# Patient Record
Sex: Female | Born: 1985 | Race: White | Hispanic: No | Marital: Married | Smoking: Never smoker
Health system: Southern US, Community
[De-identification: ages and names within clinical notes are randomized; demographics above are authoritative.]

## PROBLEM LIST (undated history)

## (undated) DIAGNOSIS — R51 Headache: Secondary | ICD-10-CM

## (undated) DIAGNOSIS — R519 Headache, unspecified: Secondary | ICD-10-CM

## (undated) HISTORY — PX: WISDOM TOOTH EXTRACTION: SHX21

---
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
## 2006-01-21 ENCOUNTER — Ambulatory Visit: Payer: Self-pay | Admitting: Internal Medicine

## 2006-03-11 ENCOUNTER — Ambulatory Visit: Payer: Self-pay | Admitting: Internal Medicine

## 2007-08-18 ENCOUNTER — Encounter: Payer: Self-pay | Admitting: Internal Medicine

## 2007-08-18 DIAGNOSIS — G43909 Migraine, unspecified, not intractable, without status migrainosus: Secondary | ICD-10-CM | POA: Insufficient documentation

## 2008-04-25 ENCOUNTER — Ambulatory Visit: Payer: Self-pay | Admitting: Internal Medicine

## 2008-04-25 ENCOUNTER — Ambulatory Visit (HOSPITAL_BASED_OUTPATIENT_CLINIC_OR_DEPARTMENT_OTHER): Admission: RE | Admit: 2008-04-25 | Discharge: 2008-04-25 | Payer: Self-pay | Admitting: Internal Medicine

## 2008-04-25 ENCOUNTER — Telehealth: Payer: Self-pay | Admitting: Internal Medicine

## 2008-04-25 DIAGNOSIS — M545 Low back pain, unspecified: Secondary | ICD-10-CM | POA: Insufficient documentation

## 2010-10-14 NOTE — Assessment & Plan Note (Signed)
Summary: hurt back/mhf   Vital Signs:  Patient Profile:   25 Years Old Female Height:     66 inches Weight:      158 pounds BMI:     25.59 Temp:     98.5 degrees F oral Pulse rate:   75 / minute BP sitting:   106 / 77  (left arm) Cuff size:   regular  Pt. in pain?   yes    Location:   lower back    Intensity:   7    Type:       dull  Vitals Entered By: Glendell Docker CMA (April 25, 2008 11:26 AM)                  Chief Complaint:  Back Pain and Back pain.  History of Present Illness:  Back Pain      This is a 25 year old woman who presents with Back pain.  The patient denies fever, chills, weakness, and loss of sensation.  The pain began at work, at home, gradually, and after lifting.  The pain is made worse by standing or walking and extension.      Current Allergies (reviewed today): No known allergies   Past Medical History:    History of Migraine Headaches    Family History:    Migraine Headache  Social History:    Occupation:  Holiday representative at [employer]    Single    Never Smoked    Alcohol use-yes (social)   Risk Factors:  Tobacco use:  never Alcohol use:  yes   Review of Systems      See HPI   Physical Exam  General:     alert, well-developed, and well-nourished.   Head:     normocephalic and atraumatic.   Lungs:     normal respiratory effort and normal breath sounds.   Heart:     normal rate, regular rhythm, no murmur, and no gallop.   Neurologic:     strength normal in all extremities, gait normal, and DTRs symmetrical and normal.     Detailed Back/Spine Exam  Lumbosacral Exam:  Inspection-deformity:    Normal Palpation-spinal tenderness:     mild tenderness over left lumbar paraspinal muscles. Lying Straight Leg Raise:    Right:  negative    Left:  negative Toe Walking:    Right:  normal    Left:  normal Heel Walking:    Right:  normal    Left:  normal    Impression & Recommendations:  Problem # 1:  LOW BACK PAIN, CHRONIC  (ICD-724.2) Pt has hx of intermittent LBP for last 4 yrs.  She previously attributed to playing golf.  Her symptoms exacerbated by moving furniture.  No radicular symptoms.  Her exam is benign.  I suspect back strain.   Trial of celebrex 200 mg x 1-2 wks.  Samples provided.  Check x ray considering chronicity of pain.  Pt is going back to [employer].  I advised against heavy lifting.  If persistent pain,  arrange MRI of LS spine.  Her updated medication list for this problem includes:    Skelaxin 800 Mg Tabs (Metaxalone) ..... One by mouth three times a day prn  Orders: T-Lumbar Spine 2 Views (72100TC)   Complete Medication List: 1)  Zovia 1/35e (28) 1-35 Mg-mcg Tabs (Ethynodiol diac-eth estradiol) .... Take 1 tablet by mouth once a day 2)  Zoloft 100 Mg Tabs (Sertraline hcl) .... Take 1 tablet by  mouth once a day 3)  Skelaxin 800 Mg Tabs (Metaxalone) .... One by mouth three times a day prn    Prescriptions: SKELAXIN 800 MG  TABS (METAXALONE) one by mouth three times a day prn  #30 x 1   Entered and Authorized by:   D. Thomos Lemons DO   Signed by:   D. Thomos Lemons DO on 04/25/2008   Method used:   Electronically sent to ...       [facility] [id number]       [address]       [place], [place]  [address]       Ph: [phone] or [phone]       Fax: [phone]   RxID:   [id number]  ] Current Allergies (reviewed today): No known allergies

## 2015-09-30 ENCOUNTER — Encounter (HOSPITAL_COMMUNITY): Payer: Self-pay | Admitting: *Deleted

## 2015-09-30 ENCOUNTER — Inpatient Hospital Stay (HOSPITAL_COMMUNITY)
Admission: AD | Admit: 2015-09-30 | Discharge: 2015-10-05 | DRG: 765 | Disposition: A | Discharge status: Home / Self Care | Payer: Managed Care, Other (non HMO) | Source: Ambulatory Visit | Attending: Obstetrics and Gynecology | Admitting: Obstetrics and Gynecology

## 2015-09-30 DIAGNOSIS — O99214 Obesity complicating childbirth: Secondary | ICD-10-CM | POA: Diagnosis present

## 2015-09-30 DIAGNOSIS — Z98891 History of uterine scar from previous surgery: Secondary | ICD-10-CM

## 2015-09-30 DIAGNOSIS — O1414 Severe pre-eclampsia complicating childbirth: Principal | ICD-10-CM | POA: Diagnosis present

## 2015-09-30 DIAGNOSIS — O1410 Severe pre-eclampsia, unspecified trimester: Secondary | ICD-10-CM | POA: Diagnosis present

## 2015-09-30 DIAGNOSIS — Z6841 Body Mass Index (BMI) 40.0 and over, adult: Secondary | ICD-10-CM

## 2015-09-30 DIAGNOSIS — Z3A35 35 weeks gestation of pregnancy: Secondary | ICD-10-CM

## 2015-09-30 HISTORY — DX: Headache: R51

## 2015-09-30 HISTORY — DX: Headache, unspecified: R51.9

## 2015-09-30 LAB — URINALYSIS, ROUTINE W REFLEX MICROSCOPIC
Bilirubin Urine: NEGATIVE
Glucose, UA: NEGATIVE mg/dL
KETONES UR: NEGATIVE mg/dL
LEUKOCYTES UA: NEGATIVE
NITRITE: NEGATIVE
PH: 5.5 (ref 5.0–8.0)
Protein, ur: NEGATIVE mg/dL
Specific Gravity, Urine: 1.025 (ref 1.005–1.030)

## 2015-09-30 LAB — CBC
HEMATOCRIT: 43.7 % (ref 36.0–46.0)
HEMOGLOBIN: 14.9 g/dL (ref 12.0–15.0)
MCH: 32.9 pg (ref 26.0–34.0)
MCHC: 34.1 g/dL (ref 30.0–36.0)
MCV: 96.5 fL (ref 78.0–100.0)
Platelets: 261 10*3/uL (ref 150–400)
RBC: 4.53 MIL/uL (ref 3.87–5.11)
RDW: 13.3 % (ref 11.5–15.5)
WBC: 10.8 10*3/uL — ABNORMAL HIGH (ref 4.0–10.5)

## 2015-09-30 LAB — OB RESULTS CONSOLE GC/CHLAMYDIA
Chlamydia: NEGATIVE
Gonorrhea: NEGATIVE

## 2015-09-30 LAB — COMPREHENSIVE METABOLIC PANEL
ALBUMIN: 3.2 g/dL — AB (ref 3.5–5.0)
ALT: 11 U/L — AB (ref 14–54)
AST: 22 U/L (ref 15–41)
Alkaline Phosphatase: 151 U/L — ABNORMAL HIGH (ref 38–126)
Anion gap: 11 (ref 5–15)
BILIRUBIN TOTAL: 0.8 mg/dL (ref 0.3–1.2)
BUN: 16 mg/dL (ref 6–20)
CHLORIDE: 102 mmol/L (ref 101–111)
CO2: 23 mmol/L (ref 22–32)
CREATININE: 0.76 mg/dL (ref 0.44–1.00)
Calcium: 9.6 mg/dL (ref 8.9–10.3)
GFR calc Af Amer: >60 mL/min (ref >60)
GFR calc non Af Amer: >60 mL/min (ref >60)
GLUCOSE: 84 mg/dL (ref 65–99)
POTASSIUM: 4.8 mmol/L (ref 3.5–5.1)
Sodium: 136 mmol/L (ref 135–145)
Total Protein: 6.6 g/dL (ref 6.5–8.1)

## 2015-09-30 LAB — URIC ACID: Uric Acid, Serum: 7.6 mg/dL — ABNORMAL HIGH (ref 2.3–6.6)

## 2015-09-30 LAB — OB RESULTS CONSOLE HEPATITIS B SURFACE ANTIGEN: HEP B S AG: NEGATIVE

## 2015-09-30 LAB — OB RESULTS CONSOLE ABO/RH: RH TYPE: POSITIVE

## 2015-09-30 LAB — TYPE AND SCREEN
ABO/RH(D): A POS
ANTIBODY SCREEN: NEGATIVE

## 2015-09-30 LAB — URINE MICROSCOPIC-ADD ON

## 2015-09-30 LAB — OB RESULTS CONSOLE GBS: GBS: NEGATIVE

## 2015-09-30 LAB — OB RESULTS CONSOLE HIV ANTIBODY (ROUTINE TESTING): HIV: NONREACTIVE

## 2015-09-30 LAB — ABO/RH: ABO/RH(D): A POS

## 2015-09-30 LAB — OB RESULTS CONSOLE RPR: RPR: NONREACTIVE

## 2015-09-30 LAB — OB RESULTS CONSOLE RUBELLA ANTIBODY, IGM: RUBELLA: IMMUNE

## 2015-09-30 LAB — OB RESULTS CONSOLE ANTIBODY SCREEN: ANTIBODY SCREEN: NEGATIVE

## 2015-09-30 MED ORDER — OXYCODONE-ACETAMINOPHEN 5-325 MG PO TABS: 1.0000 | ORAL_TABLET | ORAL | Status: DC | PRN

## 2015-09-30 MED ORDER — MAGNESIUM SULFATE BOLUS VIA INFUSION
4.0000 g | Freq: Once | INTRAVENOUS | Status: AC
  Administered 2015-09-30: 4 g via INTRAVENOUS
  Filled 2015-09-30: qty 500

## 2015-09-30 MED ORDER — ACETAMINOPHEN 325 MG PO TABS: 650.0000 mg | ORAL_TABLET | ORAL | Status: DC | PRN

## 2015-09-30 MED ORDER — PENICILLIN G POTASSIUM 5000000 UNITS IJ SOLR
5.0000 10*6.[IU] | Freq: Once | INTRAMUSCULAR | Status: DC
  Filled 2015-09-30: qty 5

## 2015-09-30 MED ORDER — PENICILLIN G POTASSIUM 5000000 UNITS IJ SOLR
2.5000 10*6.[IU] | INTRAVENOUS | Status: DC
  Filled 2015-09-30 (×2): qty 2.5

## 2015-09-30 MED ORDER — MISOPROSTOL 25 MCG QUARTER TABLET
25.0000 ug | ORAL_TABLET | ORAL | Status: DC | PRN
  Administered 2015-09-30 – 2015-10-01 (×3): 25 ug via VAGINAL
  Filled 2015-09-30 (×3): qty 0.25

## 2015-09-30 MED ORDER — MAGNESIUM SULFATE 50 % IJ SOLN
2.0000 g/h | INTRAVENOUS | Status: DC
  Administered 2015-10-01: 2 g/h via INTRAVENOUS
  Filled 2015-09-30 (×2): qty 80

## 2015-09-30 MED ORDER — FLEET ENEMA 7-19 GM/118ML RE ENEM: 1.0000 | ENEMA | RECTAL | Status: DC | PRN

## 2015-09-30 MED ORDER — OXYCODONE-ACETAMINOPHEN 5-325 MG PO TABS: 2.0000 | ORAL_TABLET | ORAL | Status: DC | PRN

## 2015-09-30 MED ORDER — OXYTOCIN 10 UNIT/ML IJ SOLN: 2.5000 [IU]/h | INTRAVENOUS | Status: DC

## 2015-09-30 MED ORDER — OXYTOCIN BOLUS FROM INFUSION: 500.0000 mL | INTRAVENOUS | Status: DC

## 2015-09-30 MED ORDER — LABETALOL HCL 5 MG/ML IV SOLN
20.0000 mg | INTRAVENOUS | Status: DC | PRN
  Administered 2015-10-01: 20 mg via INTRAVENOUS
  Filled 2015-09-30: qty 4

## 2015-09-30 MED ORDER — PENICILLIN G POTASSIUM 5000000 UNITS IJ SOLR
2.5000 10*6.[IU] | INTRAVENOUS | Status: DC
  Administered 2015-10-01: 2.5 10*6.[IU] via INTRAVENOUS
  Filled 2015-09-30 (×5): qty 2.5

## 2015-09-30 MED ORDER — HYDRALAZINE HCL 20 MG/ML IJ SOLN: 10.0000 mg | Freq: Once | INTRAMUSCULAR | Status: DC | PRN

## 2015-09-30 MED ORDER — ONDANSETRON HCL 4 MG/2ML IJ SOLN: 4.0000 mg | Freq: Four times a day (QID) | INTRAMUSCULAR | Status: DC | PRN

## 2015-09-30 MED ORDER — TERBUTALINE SULFATE 1 MG/ML IJ SOLN: 0.2500 mg | Freq: Once | INTRAMUSCULAR | Status: DC | PRN

## 2015-09-30 MED ORDER — LIDOCAINE HCL (PF) 1 % IJ SOLN: 30.0000 mL | INTRAMUSCULAR | Status: DC | PRN

## 2015-09-30 MED ORDER — LACTATED RINGERS IV SOLN
INTRAVENOUS | Status: DC
  Administered 2015-09-30 – 2015-10-01 (×2): via INTRAVENOUS

## 2015-09-30 MED ORDER — CITRIC ACID-SODIUM CITRATE 334-500 MG/5ML PO SOLN: 30.0000 mL | ORAL | Status: DC | PRN

## 2015-09-30 MED ORDER — LACTATED RINGERS IV SOLN
500.0000 mL | INTRAVENOUS | Status: DC | PRN
  Administered 2015-10-01 (×2): 500 mL via INTRAVENOUS

## 2015-09-30 MED ORDER — BUTORPHANOL TARTRATE 1 MG/ML IJ SOLN: 1.0000 mg | INTRAMUSCULAR | Status: DC | PRN

## 2015-09-30 MED ORDER — DEXTROSE 5 % IV SOLN
5.0000 10*6.[IU] | Freq: Once | INTRAVENOUS | Status: AC
  Administered 2015-10-01: 5 10*6.[IU] via INTRAVENOUS
  Filled 2015-09-30: qty 5

## 2015-09-30 NOTE — H&P (Signed)
Aimee Li. BP at home ws 160/115 @ midnight, 156/105 this am. In office 154/108 with negative protein. Had HA last night but not now. Maternal Medical History:  Fetal activity: Perceived fetal activity is normal.      OB History    Gravida Para Term Preterm AB TAB SAB Ectopic Multiple Living   1         0     Past Medical History  Diagnosis Date  . Headache     migraines   Past Surgical History  Procedure Laterality Date  . Wisdom tooth extraction     Family History: family history is not on file. Social History:  reports that she has never smoked. She does not have any smokeless tobacco history on file. She reports that she does not drink alcohol or use illicit drugs.   Prenatal Transfer Tool  Maternal Diabetes: No Genetic Screening: Normal Maternal Ultrasounds/Referrals: Normal Fetal Ultrasounds or other Referrals:  None Maternal Substance Abuse:  No Significant Maternal Medications:  None Significant Maternal Lab Results:  None Other Comments:  None  Review of Systems  HENT:       HA last pm  Eyes: Negative for blurred vision.  Gastrointestinal: Negative for abdominal pain.  Neurological: Positive for headaches.      Blood pressure 143/92, pulse 99, resp. rate 18, height 5\' 4"  (1.626 m), weight 247 lb (112.038 kg). Maternal Exam:  Uterine Assessment: Contraction strength is mild.  Contraction frequency is irregular.   Abdomen: Patient reports no abdominal tenderness. Fetal presentation: vertex     Fetal Exam Fetal State Assessment: Category I - tracings are normal.     Physical Exam  Cardiovascular: Normal rate and regular rhythm.   Respiratory: Effort normal and breath sounds normal.  GI: Soft. There is no tenderness.  Neurological: She has normal reflexes.    Cx Cl/80/-3/vtx  Prenatal labs: ABO, Rh: A/Positive/-- (01/16 0000) Antibody: Negative (01/16 0000) Rubella: Immune (01/16 0000) RPR:  Nonreactive (01/16 0000)  HBsAg: Negative (01/16 0000)  HIV: Non-reactive (01/16 0000)  GBS:     Assessment/Plan: 30 yo G1P0 @ 35 6/7 weeks with BP C/W severe preeclampsia Will check labs, begin magnesium sulfate for seizure prophylaxis, and begin 2 stage induction D/W patient above. She states she understands and agrees.   Sena Clouatre II,Rivan Siordia E 09/30/2015, 5:05 PM

## 2015-10-01 ENCOUNTER — Inpatient Hospital Stay (HOSPITAL_COMMUNITY): Payer: Managed Care, Other (non HMO) | Admitting: Anesthesiology

## 2015-10-01 ENCOUNTER — Encounter (HOSPITAL_COMMUNITY)
Admission: AD | Disposition: A | Discharge status: Home / Self Care | Payer: Self-pay | Source: Ambulatory Visit | Attending: Obstetrics and Gynecology

## 2015-10-01 ENCOUNTER — Encounter (HOSPITAL_COMMUNITY): Payer: Self-pay | Admitting: Anesthesiology

## 2015-10-01 DIAGNOSIS — Z98891 History of uterine scar from previous surgery: Secondary | ICD-10-CM

## 2015-10-01 LAB — CBC
HCT: 40.5 % (ref 36.0–46.0)
HEMATOCRIT: 43.2 % (ref 36.0–46.0)
HEMATOCRIT: 41.8 % (ref 36.0–46.0)
HEMOGLOBIN: 14.4 g/dL (ref 12.0–15.0)
Hemoglobin: 14.7 g/dL (ref 12.0–15.0)
Hemoglobin: 13.9 g/dL (ref 12.0–15.0)
MCH: 32.7 pg (ref 26.0–34.0)
MCH: 32.8 pg (ref 26.0–34.0)
MCH: 33 pg (ref 26.0–34.0)
MCHC: 34 g/dL (ref 30.0–36.0)
MCHC: 34.4 g/dL (ref 30.0–36.0)
MCHC: 34.3 g/dL (ref 30.0–36.0)
MCV: 96.2 fL (ref 78.0–100.0)
MCV: 95.2 fL (ref 78.0–100.0)
MCV: 96.2 fL (ref 78.0–100.0)
PLATELETS: 210 10*3/uL (ref 150–400)
PLATELETS: 224 10*3/uL (ref 150–400)
Platelets: 223 10*3/uL (ref 150–400)
RBC: 4.49 MIL/uL (ref 3.87–5.11)
RBC: 4.39 MIL/uL (ref 3.87–5.11)
RBC: 4.21 MIL/uL (ref 3.87–5.11)
RDW: 13.3 % (ref 11.5–15.5)
RDW: 13.3 % (ref 11.5–15.5)
RDW: 13.4 % (ref 11.5–15.5)
WBC: 11.4 10*3/uL — AB (ref 4.0–10.5)
WBC: 13.5 10*3/uL — ABNORMAL HIGH (ref 4.0–10.5)
WBC: 16.7 10*3/uL — AB (ref 4.0–10.5)

## 2015-10-01 LAB — COMPREHENSIVE METABOLIC PANEL
ALBUMIN: 2.9 g/dL — AB (ref 3.5–5.0)
ALT: 11 U/L — ABNORMAL LOW (ref 14–54)
AST: 21 U/L (ref 15–41)
Alkaline Phosphatase: 138 U/L — ABNORMAL HIGH (ref 38–126)
Anion gap: 8 (ref 5–15)
BUN: 13 mg/dL (ref 6–20)
CHLORIDE: 105 mmol/L (ref 101–111)
CO2: 21 mmol/L — AB (ref 22–32)
Calcium: 7.9 mg/dL — ABNORMAL LOW (ref 8.9–10.3)
Creatinine, Ser: 0.76 mg/dL (ref 0.44–1.00)
GFR calc Af Amer: >60 mL/min (ref >60)
GFR calc non Af Amer: >60 mL/min (ref >60)
GLUCOSE: 86 mg/dL (ref 65–99)
POTASSIUM: 4.2 mmol/L (ref 3.5–5.1)
SODIUM: 134 mmol/L — AB (ref 135–145)
Total Bilirubin: 0.7 mg/dL (ref 0.3–1.2)
Total Protein: 6.5 g/dL (ref 6.5–8.1)

## 2015-10-01 LAB — RPR: RPR Ser Ql: NONREACTIVE

## 2015-10-01 LAB — URIC ACID: Uric Acid, Serum: 7.5 mg/dL — ABNORMAL HIGH (ref 2.3–6.6)

## 2015-10-01 SURGERY — Surgical Case
Anesthesia: Epidural

## 2015-10-01 MED ORDER — PROMETHAZINE HCL 25 MG/ML IJ SOLN: 6.2500 mg | INTRAMUSCULAR | Status: DC | PRN

## 2015-10-01 MED ORDER — DIBUCAINE 1 % RE OINT: 1.0000 "application " | TOPICAL_OINTMENT | RECTAL | Status: DC | PRN

## 2015-10-01 MED ORDER — OXYTOCIN 10 UNIT/ML IJ SOLN
1.0000 m[IU]/min | INTRAVENOUS | Status: DC
  Administered 2015-10-01 (×2): 2 m[IU]/min via INTRAVENOUS
  Filled 2015-10-01: qty 4

## 2015-10-01 MED ORDER — MAGNESIUM SULFATE BOLUS VIA INFUSION
4.0000 g | Freq: Once | INTRAVENOUS | Status: DC
  Filled 2015-10-01: qty 500

## 2015-10-01 MED ORDER — CEFAZOLIN SODIUM-DEXTROSE 2-3 GM-% IV SOLR
INTRAVENOUS | Status: DC | PRN
  Administered 2015-10-01: 2 g via INTRAVENOUS

## 2015-10-01 MED ORDER — KETOROLAC TROMETHAMINE 30 MG/ML IJ SOLN
30.0000 mg | Freq: Four times a day (QID) | INTRAMUSCULAR | Status: DC | PRN
  Filled 2015-10-01: qty 1

## 2015-10-01 MED ORDER — NALBUPHINE HCL 10 MG/ML IJ SOLN: 5.0000 mg | INTRAMUSCULAR | Status: DC | PRN

## 2015-10-01 MED ORDER — ONDANSETRON HCL 4 MG/2ML IJ SOLN
INTRAMUSCULAR | Status: DC | PRN
  Administered 2015-10-01: 4 mg via INTRAVENOUS

## 2015-10-01 MED ORDER — MENTHOL 3 MG MT LOZG
1.0000 | LOZENGE | OROMUCOSAL | Status: DC | PRN
  Filled 2015-10-01: qty 9

## 2015-10-01 MED ORDER — SIMETHICONE 80 MG PO CHEW
80.0000 mg | CHEWABLE_TABLET | Freq: Three times a day (TID) | ORAL | Status: DC
  Administered 2015-10-01 – 2015-10-05 (×11): 80 mg via ORAL
  Filled 2015-10-01 (×10): qty 1

## 2015-10-01 MED ORDER — ONDANSETRON HCL 4 MG/2ML IJ SOLN
INTRAMUSCULAR | Status: AC
  Filled 2015-10-01: qty 2

## 2015-10-01 MED ORDER — ONDANSETRON HCL 4 MG/2ML IJ SOLN: 4.0000 mg | Freq: Three times a day (TID) | INTRAMUSCULAR | Status: DC | PRN

## 2015-10-01 MED ORDER — LACTATED RINGERS IV SOLN
INTRAVENOUS | Status: DC
  Administered 2015-10-01: 23:00:00 via INTRAVENOUS

## 2015-10-01 MED ORDER — DEXAMETHASONE SODIUM PHOSPHATE 10 MG/ML IJ SOLN
INTRAMUSCULAR | Status: DC | PRN
  Administered 2015-10-01: 4 mg via INTRAVENOUS

## 2015-10-01 MED ORDER — LIDOCAINE-EPINEPHRINE (PF) 2 %-1:200000 IJ SOLN
INTRAMUSCULAR | Status: AC
  Filled 2015-10-01: qty 20

## 2015-10-01 MED ORDER — KETOROLAC TROMETHAMINE 30 MG/ML IJ SOLN
30.0000 mg | Freq: Four times a day (QID) | INTRAMUSCULAR | Status: DC | PRN
  Administered 2015-10-01: 30 mg via INTRAMUSCULAR
  Filled 2015-10-01: qty 1

## 2015-10-01 MED ORDER — CITRIC ACID-SODIUM CITRATE 334-500 MG/5ML PO SOLN
ORAL | Status: AC
  Filled 2015-10-01: qty 15

## 2015-10-01 MED ORDER — TETANUS-DIPHTH-ACELL PERTUSSIS 5-2.5-18.5 LF-MCG/0.5 IM SUSP: 0.5000 mL | Freq: Once | INTRAMUSCULAR | Status: DC

## 2015-10-01 MED ORDER — HYDROMORPHONE HCL 1 MG/ML IJ SOLN: 0.2500 mg | INTRAMUSCULAR | Status: DC | PRN

## 2015-10-01 MED ORDER — ZOLPIDEM TARTRATE 5 MG PO TABS: 5.0000 mg | ORAL_TABLET | Freq: Every evening | ORAL | Status: DC | PRN

## 2015-10-01 MED ORDER — PHENYLEPHRINE 40 MCG/ML (10ML) SYRINGE FOR IV PUSH (FOR BLOOD PRESSURE SUPPORT)
PREFILLED_SYRINGE | INTRAVENOUS | Status: AC
  Filled 2015-10-01: qty 10

## 2015-10-01 MED ORDER — NALOXONE HCL 2 MG/2ML IJ SOSY: 1.0000 ug/kg/h | PREFILLED_SYRINGE | INTRAVENOUS | Status: DC | PRN

## 2015-10-01 MED ORDER — MAGNESIUM SULFATE 50 % IJ SOLN
2.0000 g/h | INTRAVENOUS | Status: DC
  Filled 2015-10-01 (×2): qty 80

## 2015-10-01 MED ORDER — MEPERIDINE HCL 25 MG/ML IJ SOLN: 6.2500 mg | INTRAMUSCULAR | Status: DC | PRN

## 2015-10-01 MED ORDER — DIPHENHYDRAMINE HCL 50 MG/ML IJ SOLN: 12.5000 mg | INTRAMUSCULAR | Status: DC | PRN

## 2015-10-01 MED ORDER — PHENYLEPHRINE 40 MCG/ML (10ML) SYRINGE FOR IV PUSH (FOR BLOOD PRESSURE SUPPORT)
80.0000 ug | PREFILLED_SYRINGE | INTRAVENOUS | Status: AC | PRN
  Administered 2015-10-01 (×4): 80 ug via INTRAVENOUS
  Filled 2015-10-01: qty 20

## 2015-10-01 MED ORDER — NALBUPHINE HCL 10 MG/ML IJ SOLN
5.0000 mg | INTRAMUSCULAR | Status: DC | PRN
Start: 2015-10-01 — End: 2015-10-05

## 2015-10-01 MED ORDER — FENTANYL 2.5 MCG/ML BUPIVACAINE 1/10 % EPIDURAL INFUSION (WH - ANES)
14.0000 mL/h | INTRAMUSCULAR | Status: DC | PRN
  Administered 2015-10-01: 14 mL/h via EPIDURAL
  Filled 2015-10-01: qty 125

## 2015-10-01 MED ORDER — SODIUM CHLORIDE 0.9 % IJ SOLN: 3.0000 mL | INTRAMUSCULAR | Status: DC | PRN

## 2015-10-01 MED ORDER — MAGNESIUM SULFATE 40 G IN LACTATED RINGERS - SIMPLE
INTRAVENOUS | Status: DC | PRN
  Administered 2015-10-01: 2 g/h via INTRAVENOUS

## 2015-10-01 MED ORDER — KETOROLAC TROMETHAMINE 30 MG/ML IJ SOLN: 30.0000 mg | Freq: Four times a day (QID) | INTRAMUSCULAR | Status: DC

## 2015-10-01 MED ORDER — DIPHENHYDRAMINE HCL 25 MG PO CAPS: 25.0000 mg | ORAL_CAPSULE | ORAL | Status: DC | PRN

## 2015-10-01 MED ORDER — SCOPOLAMINE 1 MG/3DAYS TD PT72: 1.0000 | MEDICATED_PATCH | Freq: Once | TRANSDERMAL | Status: DC

## 2015-10-01 MED ORDER — LANOLIN HYDROUS EX OINT: 1.0000 "application " | TOPICAL_OINTMENT | CUTANEOUS | Status: DC | PRN

## 2015-10-01 MED ORDER — NALBUPHINE HCL 10 MG/ML IJ SOLN: 5.0000 mg | Freq: Once | INTRAMUSCULAR | Status: DC | PRN

## 2015-10-01 MED ORDER — MORPHINE SULFATE (PF) 0.5 MG/ML IJ SOLN
INTRAMUSCULAR | Status: AC
  Filled 2015-10-01: qty 10

## 2015-10-01 MED ORDER — EPHEDRINE 5 MG/ML INJ: 20.0000 mg | Freq: Once | INTRAVENOUS | Status: DC

## 2015-10-01 MED ORDER — MORPHINE SULFATE (PF) 0.5 MG/ML IJ SOLN
INTRAMUSCULAR | Status: DC | PRN
  Administered 2015-10-01: 3 mg via EPIDURAL

## 2015-10-01 MED ORDER — LACTATED RINGERS IV SOLN
INTRAVENOUS | Status: DC | PRN
  Administered 2015-10-01: 15:00:00 via INTRAVENOUS

## 2015-10-01 MED ORDER — EPHEDRINE 5 MG/ML INJ
10.0000 mg | INTRAVENOUS | Status: DC | PRN
  Administered 2015-10-01: 10 mg via INTRAVENOUS
  Filled 2015-10-01: qty 4

## 2015-10-01 MED ORDER — NALOXONE HCL 0.4 MG/ML IJ SOLN: 0.4000 mg | INTRAMUSCULAR | Status: DC | PRN

## 2015-10-01 MED ORDER — SIMETHICONE 80 MG PO CHEW
80.0000 mg | CHEWABLE_TABLET | ORAL | Status: DC
  Administered 2015-10-01 – 2015-10-04 (×4): 80 mg via ORAL
  Filled 2015-10-01 (×5): qty 1

## 2015-10-01 MED ORDER — KETOROLAC TROMETHAMINE 30 MG/ML IJ SOLN
INTRAMUSCULAR | Status: AC
  Filled 2015-10-01: qty 1

## 2015-10-01 MED ORDER — LIDOCAINE-EPINEPHRINE (PF) 2 %-1:200000 IJ SOLN
INTRAMUSCULAR | Status: DC | PRN
  Administered 2015-10-01: 3 mL via EPIDURAL
  Administered 2015-10-01 (×3): 5 mL via EPIDURAL

## 2015-10-01 MED ORDER — OXYTOCIN 10 UNIT/ML IJ SOLN
2.5000 [IU]/h | INTRAMUSCULAR | Status: AC
  Administered 2015-10-01: 2.5 [IU]/h via INTRAVENOUS
  Filled 2015-10-01: qty 4

## 2015-10-01 MED ORDER — SODIUM BICARBONATE 8.4 % IV SOLN
INTRAVENOUS | Status: AC
  Filled 2015-10-01: qty 50

## 2015-10-01 MED ORDER — PRENATAL MULTIVITAMIN CH
1.0000 | ORAL_TABLET | Freq: Every day | ORAL | Status: DC
  Administered 2015-10-02 – 2015-10-04 (×3): 1 via ORAL
  Filled 2015-10-01 (×3): qty 1

## 2015-10-01 MED ORDER — IBUPROFEN 600 MG PO TABS: 600.0000 mg | ORAL_TABLET | Freq: Four times a day (QID) | ORAL | Status: DC | PRN

## 2015-10-01 MED ORDER — TERBUTALINE SULFATE 1 MG/ML IJ SOLN: 0.2500 mg | Freq: Once | INTRAMUSCULAR | Status: DC | PRN

## 2015-10-01 MED ORDER — DEXAMETHASONE SODIUM PHOSPHATE 4 MG/ML IJ SOLN
INTRAMUSCULAR | Status: AC
  Filled 2015-10-01: qty 1

## 2015-10-01 MED ORDER — IBUPROFEN 600 MG PO TABS
600.0000 mg | ORAL_TABLET | Freq: Four times a day (QID) | ORAL | Status: DC
  Administered 2015-10-01 – 2015-10-05 (×14): 600 mg via ORAL
  Filled 2015-10-01 (×14): qty 1

## 2015-10-01 MED ORDER — EPHEDRINE SULFATE 50 MG/ML IJ SOLN
INTRAMUSCULAR | Status: AC
  Administered 2015-10-01: 20 mg
  Filled 2015-10-01: qty 1

## 2015-10-01 MED ORDER — DIPHENHYDRAMINE HCL 25 MG PO CAPS: 25.0000 mg | ORAL_CAPSULE | Freq: Four times a day (QID) | ORAL | Status: DC | PRN

## 2015-10-01 MED ORDER — ACETAMINOPHEN 325 MG PO TABS
650.0000 mg | ORAL_TABLET | ORAL | Status: DC | PRN
  Administered 2015-10-04: 650 mg via ORAL
  Filled 2015-10-01: qty 2

## 2015-10-01 MED ORDER — LIDOCAINE HCL (PF) 1 % IJ SOLN
INTRAMUSCULAR | Status: DC | PRN
  Administered 2015-10-01 (×2): 8 mL via EPIDURAL

## 2015-10-01 MED ORDER — ACETAMINOPHEN 500 MG PO TABS
1000.0000 mg | ORAL_TABLET | Freq: Four times a day (QID) | ORAL | Status: AC
  Administered 2015-10-01 – 2015-10-02 (×4): 1000 mg via ORAL
  Filled 2015-10-01 (×4): qty 2

## 2015-10-01 MED ORDER — SENNOSIDES-DOCUSATE SODIUM 8.6-50 MG PO TABS
2.0000 | ORAL_TABLET | ORAL | Status: DC
  Administered 2015-10-01 – 2015-10-04 (×4): 2 via ORAL
  Filled 2015-10-01 (×4): qty 2

## 2015-10-01 MED ORDER — OXYTOCIN 10 UNIT/ML IJ SOLN
INTRAMUSCULAR | Status: DC | PRN
  Administered 2015-10-01: 40 [IU] via INTRAMUSCULAR

## 2015-10-01 MED ORDER — EPHEDRINE SULFATE 50 MG/ML IJ SOLN
INTRAMUSCULAR | Status: AC
  Filled 2015-10-01: qty 1

## 2015-10-01 MED ORDER — SIMETHICONE 80 MG PO CHEW: 80.0000 mg | CHEWABLE_TABLET | ORAL | Status: DC | PRN

## 2015-10-01 MED ORDER — LACTATED RINGERS IV SOLN
INTRAVENOUS | Status: DC | PRN
  Administered 2015-10-01: 14:00:00 via INTRAVENOUS

## 2015-10-01 MED ORDER — WITCH HAZEL-GLYCERIN EX PADS: 1.0000 "application " | MEDICATED_PAD | CUTANEOUS | Status: DC | PRN

## 2015-10-01 SURGICAL SUPPLY — 35 items
BARRIER ADHS 3X4 INTERCEED (GAUZE/BANDAGES/DRESSINGS) IMPLANT
BENZOIN TINCTURE PRP APPL 2/3 (GAUZE/BANDAGES/DRESSINGS) ×3 IMPLANT
CLAMP CORD UMBIL (MISCELLANEOUS) IMPLANT
CLOSURE WOUND 1/2 X4 (GAUZE/BANDAGES/DRESSINGS) ×1
CLOTH BEACON ORANGE TIMEOUT ST (SAFETY) ×3 IMPLANT
CONTAINER PREFILL 10% NBF 15ML (MISCELLANEOUS) IMPLANT
DRAPE SHEET LG 3/4 BI-LAMINATE (DRAPES) IMPLANT
DRSG OPSITE POSTOP 4X10 (GAUZE/BANDAGES/DRESSINGS) ×3 IMPLANT
DURAPREP 26ML APPLICATOR (WOUND CARE) ×3 IMPLANT
ELECT REM PT RETURN 9FT ADLT (ELECTROSURGICAL) ×3
ELECTRODE REM PT RTRN 9FT ADLT (ELECTROSURGICAL) ×1 IMPLANT
EXTRACTOR VACUUM M CUP 4 TUBE (SUCTIONS) ×2 IMPLANT
EXTRACTOR VACUUM M CUP 4' TUBE (SUCTIONS) ×1
GLOVE BIO SURGEON STRL SZ 6.5 (GLOVE) ×2 IMPLANT
GLOVE BIO SURGEONS STRL SZ 6.5 (GLOVE) ×1
GLOVE BIOGEL PI IND STRL 7.0 (GLOVE) ×1 IMPLANT
GLOVE BIOGEL PI INDICATOR 7.0 (GLOVE) ×2
GOWN STRL REUS W/TWL LRG LVL3 (GOWN DISPOSABLE) ×6 IMPLANT
KIT ABG SYR 3ML LUER SLIP (SYRINGE) IMPLANT
NEEDLE HYPO 22GX1.5 SAFETY (NEEDLE) IMPLANT
NEEDLE HYPO 25X5/8 SAFETYGLIDE (NEEDLE) ×3 IMPLANT
NS IRRIG 1000ML POUR BTL (IV SOLUTION) ×3 IMPLANT
PACK C SECTION WH (CUSTOM PROCEDURE TRAY) ×3 IMPLANT
PAD OB MATERNITY 4.3X12.25 (PERSONAL CARE ITEMS) ×3 IMPLANT
PENCIL SMOKE EVAC W/HOLSTER (ELECTROSURGICAL) ×3 IMPLANT
STRIP CLOSURE SKIN 1/2X4 (GAUZE/BANDAGES/DRESSINGS) ×2 IMPLANT
SUT CHROMIC 0 CTX 36 (SUTURE) ×6 IMPLANT
SUT PLAIN 0 NONE (SUTURE) IMPLANT
SUT PLAIN 2 0 XLH (SUTURE) IMPLANT
SUT VIC AB 0 CT1 27 (SUTURE) ×6
SUT VIC AB 0 CT1 27XBRD ANBCTR (SUTURE) ×3 IMPLANT
SUT VIC AB 4-0 KS 27 (SUTURE) ×3 IMPLANT
SYR CONTROL 10ML LL (SYRINGE) IMPLANT
TOWEL OR 17X24 6PK STRL BLUE (TOWEL DISPOSABLE) ×3 IMPLANT
TRAY FOLEY CATH SILVER 14FR (SET/KITS/TRAYS/PACK) ×3 IMPLANT

## 2015-10-01 NOTE — Progress Notes (Signed)
Patient is doing well.  Blood pressures noted FHR Category 1 Toco few contractions Cervix is 90% 2 cm -2 Vertex AROM clear fluid  Continue magnesium Start pitocin Follow labor curve

## 2015-10-01 NOTE — Brief Op Note (Signed)
09/30/2015 - 10/01/2015  2:56 PM  PATIENT:  Aimee Li  30 y.o. female  PRE-OPERATIVE DIAGNOSIS:   IUP at 14 w 0 days Severe Preeclampsia Fetal Bradycardia  POST-OPERATIVE DIAGNOSIS:   Same Short Umbilical cord PROCEDURE:  Procedure(s): CESAREAN SECTION (N/A)  SURGEON:  Surgeon(s) and Role:    * Marcelle Overlie, MD - Primary  PHYSICIAN ASSISTANT:   ASSISTANTS: none   ANESTHESIA:   epidural  EBL:  Total I/O In: 2153.4 [P.O.:540; I.V.:1263.4; IV Piggyback:350] Out: 1950 [Urine:1400; Emesis/NG output:50; Blood:500]  BLOOD ADMINISTERED:none  DRAINS: Urinary Catheter (Foley)   LOCAL MEDICATIONS USED:  NONE  SPECIMEN:  Source of Specimen:  placenta  DISPOSITION OF SPECIMEN:  pathology COUNTS:  YES  TOURNIQUET:  * No tourniquets in log *  DICTATION: .Other Dictation: Dictation Number [id number]  PLAN OF CARE: Admit to inpatient   PATIENT DISPOSITION:  ICU - extubated and stable.   Delay start of Pharmacological VTE agent (>24hrs) due to surgical blood loss or risk of bleeding: not applicable

## 2015-10-01 NOTE — Anesthesia Postprocedure Evaluation (Signed)
Anesthesia Post Note  Patient: Aimee Li  Procedure(s) Performed: Procedure(s) (LRB): CESAREAN SECTION (N/A)  Patient location during evaluation: PACU Anesthesia Type: Epidural Level of consciousness: awake Pain management: pain level controlled Vital Signs Assessment: post-procedure vital signs reviewed and stable Respiratory status: spontaneous breathing Cardiovascular status: stable Postop Assessment: no headache, no backache, epidural receding, no signs of nausea or vomiting and patient able to bend at knees Anesthetic complications: no    Last Vitals:  Filed Vitals:   10/01/15 1615 10/01/15 1630  BP: 98/69 101/71  Pulse: 75 80  Temp:    Resp: 17 18    Last Pain:  Filed Vitals:   10/01/15 1631  PainSc: 0-No pain                 Delcia Spitzley JR,JOHN Isobelle Tuckett

## 2015-10-01 NOTE — Transfer of Care (Signed)
Immediate Anesthesia Transfer of Care Note  Patient: Aimee Li  Procedure(s) Performed: Procedure(s): CESAREAN SECTION (N/A)  Patient Location: PACU  Anesthesia Type:Epidural  Level of Consciousness: awake, alert  and oriented  Airway & Oxygen Therapy: Patient Spontanous Breathing  Post-op Assessment: Report given to RN and Post -op Vital signs reviewed and stable  Post vital signs: Reviewed and stable  Last Vitals:  Filed Vitals:   10/01/15 1424 10/01/15 1425  BP: 121/62   Pulse: 101 102  Temp:    Resp:      Complications: No apparent anesthesia complications

## 2015-10-01 NOTE — Op Note (Signed)
Aimee Li, Aimee Li NO.:  [id number]  MEDICAL RECORD NO.:  [id number]  LOCATION:  9156                          FACILITY:  [facility]  PHYSICIAN:  Duriel Deery L. Jaxie Racanelli, M.D.DATE OF BIRTH:  Oct 28, 1985  DATE OF PROCEDURE:  10/01/2015 DATE OF DISCHARGE:                              OPERATIVE REPORT   PREOPERATIVE DIAGNOSES:  Intrauterine pregnancy at 36 weeks, severe preeclampsia and fetal bradycardia.  POSTOPERATIVE DIAGNOSES:  Intrauterine pregnancy at 36 weeks, severe preeclampsia and fetal bradycardia and short umbilical cord.  SURGERY:  Primary low transverse cesarean section.  SURGEON:  Terie Lear L. Armari Fussell, MD  ANESTHESIA:  Epidural.  EBL:  Less than 500 mL.  COMPLICATIONS:  None.  DRAINS:  Foley.  SPECIMEN:  Placenta sent to Pathology.  PROCEDURE IN DETAIL:  Patient was taken to the operating room.  She was found to be adequately anesthetized with her epidural and Betadine was used to prep the abdomen.  A Foley catheter and FSA were already on.  A drape was applied.  A low transverse incision was made, carried down to the fascia.  Fascia scored in the midline and extended laterally.  The rectus muscles were separated in the midline.  The peritoneum was entered bluntly.  The bladder blade was inserted.  The lower uterine segment was identified.  The bladder flap was developed quickly.  A low transverse incision was made in the uterus.  Uterus was entered using hemostat.  The baby was in cephalic presentation, was delivered easily with 1 gentle pull of the vacuum, was in occiput posterior position and there was a very short umbilical cord, I do believe the short umbilical cord was the cause of the fetal bradycardia.  The cord was clamped and cut.  The baby was handed to the awaiting pediatricians.  Apgars 7 and 9.  The placenta was manually removed, noted to be normal intact with a three-vessel cord and sent to Pathology.  The uterus was cleared of  all clots and debris.  The uterine incision was closed in 2 layers using 0 chromic in a running, locked stitch.  The uterus was returned to the abdomen.  Irrigation was performed.  Hemostasis was noted.  The peritoneum was closed using 0 Vicryl.  The fascia was closed using 0 Vicryl starting each corner meeting in the midline.  After irrigation of subcutaneous layer, the skin was closed with 3-0 Vicryl on a Keith needle.  All sponge, lap, and instrument counts were correct x2.  Patient went to recovery room in stable condition.     [name], M.D.     [name]  D:  10/01/2015  T:  10/01/2015  Job:  [id number]

## 2015-10-01 NOTE — Lactation Note (Signed)
This note was copied from the chart of Aimee Li. Lactation Consultation Note  Patient Name: Aimee Li Date: 10/01/2015 Reason for consult: Initial assessment;NICU baby  NICU baby 4 hours old, [redacted]w[redacted]d GA. Assisted mom to start pumping with DEBP. Demonstrated hand expression with no colostrum present at this time. Discussed normal progression of milk coming to volume. Enc mom to pump 8 times/24 hours for 15 minutes followed by hand expression. Parents enc to take colostrum to NICU or call to have it refrigerated. Discussed EBM storage guidelines. Mom given NICU booklet and LC brochure with review. Mom aware of OP/BFSG and LC phone line assistance after D/C. Mom states that she is intending to go to NICU and attempt to have baby at breast tonight. Maternal Data Has patient been taught Hand Expression?: Yes Does the patient have breastfeeding experience prior to this delivery?: No  Feeding    LATCH Score/Interventions                      Lactation Tools Discussed/Used Pump Review: Setup, frequency, and cleaning;Milk Storage Initiated by:: JW Date initiated:: 10/01/15   Consult Status Consult Status: Follow-up Date: 10/02/15 Follow-up type: In-patient    Aimee Li 10/01/2015, 7:23 PM

## 2015-10-01 NOTE — Anesthesia Procedure Notes (Signed)
Epidural Patient location during procedure: OB Start time: 10/01/2015 1:44 PM End time: 10/01/2015 1:48 PM  Staffing Anesthesiologist: Leilani Able Performed by: anesthesiologist   Preanesthetic Checklist Completed: patient identified, surgical consent, pre-op evaluation, timeout performed, IV checked, risks and benefits discussed and monitors and equipment checked  Epidural Patient position: sitting Prep: site prepped and draped and DuraPrep Patient monitoring: continuous pulse ox and blood pressure Approach: midline Location: L3-L4 Injection technique: LOR air  Needle:  Needle type: Tuohy  Needle gauge: 17 G Needle length: 9 cm and 9 Needle insertion depth: 7 cm Catheter type: closed end flexible Catheter size: 19 Gauge Catheter at skin depth: 12 cm Test dose: negative and Other  Assessment Sensory level: T9 Events: blood not aspirated, injection not painful, no injection resistance, negative IV test and no paresthesia  Additional Notes Reason for block:procedure for pain

## 2015-10-01 NOTE — Progress Notes (Signed)
Lactation Consultant into see pt and assist with pumping.

## 2015-10-01 NOTE — Progress Notes (Signed)
No C/O BP 127/80 FHT cat one Cytotec x 2 done  A/P: Labs at 5 am         Pitocin @ 7 am         D/W patient

## 2015-10-01 NOTE — Anesthesia Preprocedure Evaluation (Addendum)
Anesthesia Evaluation  Patient identified by MRN, date of birth, ID band Patient awake    Reviewed: Allergy & Precautions, H&P , NPO status , Patient's Chart, lab work & pertinent test results  Airway Mallampati: II  TM Distance: >3 FB Neck ROM: full    Dental no notable dental hx.    Pulmonary neg pulmonary ROS,    Pulmonary exam normal        Cardiovascular negative cardio ROS Normal cardiovascular exam     Neuro/Psych negative psych ROS   GI/Hepatic negative GI ROS, Neg liver ROS,   Endo/Other  Morbid obesity  Renal/GU negative Renal ROS     Musculoskeletal   Abdominal (+) + obese,   Peds  Hematology negative hematology ROS (+)   Anesthesia Other Findings   Reproductive/Obstetrics (+) Pregnancy                             Anesthesia Physical Anesthesia Plan  ASA: III  Anesthesia Plan: Epidural   Post-op Pain Management:    Induction:   Airway Management Planned:   Additional Equipment:   Intra-op Plan:   Post-operative Plan:   Informed Consent: I have reviewed the patients History and Physical, chart, labs and discussed the procedure including the risks, benefits and alternatives for the proposed anesthesia with the patient or authorized representative who has indicated his/her understanding and acceptance.     Plan Discussed with:   Anesthesia Plan Comments: (For stat C/S with epidural intact and dosed)       Anesthesia Quick Evaluation

## 2015-10-02 ENCOUNTER — Encounter (HOSPITAL_COMMUNITY): Payer: Self-pay | Admitting: Obstetrics and Gynecology

## 2015-10-02 LAB — COMPREHENSIVE METABOLIC PANEL
ALK PHOS: 111 U/L (ref 38–126)
ALT: 9 U/L — AB (ref 14–54)
ANION GAP: 6 (ref 5–15)
AST: 32 U/L (ref 15–41)
Albumin: 2.6 g/dL — ABNORMAL LOW (ref 3.5–5.0)
BILIRUBIN TOTAL: 0.7 mg/dL (ref 0.3–1.2)
BUN: 11 mg/dL (ref 6–20)
CALCIUM: 7.2 mg/dL — AB (ref 8.9–10.3)
CO2: 25 mmol/L (ref 22–32)
CREATININE: 0.79 mg/dL (ref 0.44–1.00)
Chloride: 102 mmol/L (ref 101–111)
Glucose, Bld: 83 mg/dL (ref 65–99)
Potassium: 5 mmol/L (ref 3.5–5.1)
SODIUM: 133 mmol/L — AB (ref 135–145)
TOTAL PROTEIN: 6.1 g/dL — AB (ref 6.5–8.1)

## 2015-10-02 LAB — CBC
HEMATOCRIT: 40.2 % (ref 36.0–46.0)
HEMOGLOBIN: 13.5 g/dL (ref 12.0–15.0)
MCH: 32.5 pg (ref 26.0–34.0)
MCHC: 33.6 g/dL (ref 30.0–36.0)
MCV: 96.9 fL (ref 78.0–100.0)
Platelets: 206 10*3/uL (ref 150–400)
RBC: 4.15 MIL/uL (ref 3.87–5.11)
RDW: 13.6 % (ref 11.5–15.5)
WBC: 16.9 10*3/uL — ABNORMAL HIGH (ref 4.0–10.5)

## 2015-10-02 NOTE — Progress Notes (Signed)
Subjective: Postpartum Day 1: Cesarean Delivery Patient reports tolerating PO and no problems voiding.    Objective: Vital signs in last 24 hours: Temp:  [97.6 F (36.4 C)-98.2 F (36.8 C)] 97.6 F (36.4 C) (01/18 0600) Pulse Rate:  [64-104] 74 (01/18 0700) Resp:  [14-20] 16 (01/18 0700) BP: (87-166)/(45-115) 112/70 mmHg (01/18 0700) SpO2:  [90 %-100 %] 95 % (01/18 0700)  Physical Exam:  General: alert and cooperative Lochia: appropriate Uterine Fundus: firm Incision: no significant drainage DVT Evaluation: No evidence of DVT seen on physical exam.   Recent Labs  10/01/15 1754 10/02/15 0545  HGB 13.9 13.5  HCT 40.5 40.2    Assessment/Plan: Status post Cesarean section. Doing well postoperatively.   BP stable Continue current care. Will d/c magnesium  Aimee Li 10/02/2015, 8:21 AM

## 2015-10-02 NOTE — Lactation Note (Signed)
This note was copied from the chart of Aimee Li. Lactation Consultation Note  Patient Name: Aimee Li [id number] Date: 10/02/2015 Reason for consult: Follow-up assessment;NICU baby NICU baby 85 hours old. Assisted with latching baby in NICU. Assisted mom to latch baby in football position to right breast. Mom's nipples are flat, and baby not able to latch. However, baby did lick at breast for a few minutes with several attempts. Fitted mom with a #20 NS, but baby still not wanting to suckle. Baby gently suckled this LC's gloved finger, but just slept at breast. Mom states that she has been pumping routinely through the night but is not getting very much. Discussed normal progression of milk coming to volume, and emphasized the benefits of having baby STS and at the breast. Enc mom to pump and hand express after having baby at breast. Mom aware of pumping rooms in NICU, and is aware of 2-week DEBP rental for hospital-grade pump.  Assessed first 10 minutes of baby at breast with no nutritive suckling, and baby tolerated well.   Maternal Data    Feeding Feeding Type: Breast Fed Length of feed: 0 min  LATCH Score/Interventions Latch: Too sleepy or reluctant, no latch achieved, no sucking elicited. Intervention(s): Skin to skin;Teach feeding cues;Waking techniques Intervention(s): Adjust position;Assist with latch;Breast compression  Audible Swallowing: None Intervention(s): Skin to skin Intervention(s): Skin to skin  Type of Nipple: Flat  Comfort (Breast/Nipple): Soft / non-tender     Hold (Positioning): Assistance needed to correctly position infant at breast and maintain latch. Intervention(s): Breastfeeding basics reviewed;Support Pillows;Position options;Skin to skin  LATCH Score: 4  Lactation Tools Discussed/Used Tools: Nipple Shields Nipple shield size: 20   Consult Status Consult Status: Follow-up Date: 10/03/15 Follow-up type: In-patient    Geralynn Ochs 10/02/2015, 12:10 PM

## 2015-10-02 NOTE — Anesthesia Postprocedure Evaluation (Signed)
Anesthesia Post Note  Patient: Aimee Li  Procedure(s) Performed: Procedure(s) (LRB): CESAREAN SECTION (N/A)  Patient location during evaluation: Antenatal Anesthesia Type: Epidural Level of consciousness: awake and alert Pain management: pain level controlled Vital Signs Assessment: post-procedure vital signs reviewed and stable Respiratory status: spontaneous breathing Cardiovascular status: stable Postop Assessment: no headache, no backache, no signs of nausea or vomiting and adequate PO intake Anesthetic complications: no    Last Vitals:  Filed Vitals:   10/02/15 0600 10/02/15 0700  BP: 122/84 112/70  Pulse: 71 74  Temp: 36.4 C   Resp: 18 16    Last Pain:  Filed Vitals:   10/02/15 0822  PainSc: 2                  [name]

## 2015-10-02 NOTE — Addendum Note (Signed)
Addendum  created 10/02/15 0851 by Elgie Congo, CRNA   Modules edited: Clinical Notes   Clinical Notes:  File: [id number]

## 2015-10-03 MED ORDER — OXYCODONE-ACETAMINOPHEN 5-325 MG PO TABS
1.0000 | ORAL_TABLET | ORAL | Status: DC | PRN
  Administered 2015-10-03: 2 via ORAL
  Administered 2015-10-04: 1 via ORAL
  Filled 2015-10-03: qty 2
  Filled 2015-10-03: qty 1

## 2015-10-03 NOTE — Clinical Social Work Maternal (Signed)
CLINICAL SOCIAL WORK MATERNAL/CHILD NOTE  Patient Details  Name: Aimee Li MRN: [id number] Date of Birth: 01/03/1986  Date:  10/03/2015  Clinical Social Worker Initiating Note:  Joby Richart E. Danilo Cappiello, LCSW Date/ Time Initiated:  10/03/15/1500     Child's Name:  John Kraker   Legal Guardian:   (Parents: Chanie and Carson Pineiro)   Need for Interpreter:  None   Date of Referral:        Reason for Referral:   (No referral-NICU admission)   Referral Source:      Address:  [address], [place], [place] [address]  Phone number:  [phone]   Household Members:  Spouse   Natural Supports (not living in the home):  Immediate Family, Extended Family, Friends (MOB reports having a good support system.  She states her mother lives in  and is very involved. 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.)   Education:      Financial Resources:  Private Insurance   Other Resources:      Cultural/Religious Considerations Which May Impact Care: None stated.  Strengths:  Ability to meet basic needs , Understanding of illness, Compliance with medical plan , Home prepared for child    Risk Factors/Current Problems:  None   Cognitive State:  Alert , Able to Concentrate , Linear Thinking , Goal Oriented , Insightful    Mood/Affect:  Calm , Relaxed , Interested , Comfortable , Euthymic    CSW Assessment: CSW met with MOB at baby's bedside to introduce services, offer support, and complete assessment due to baby's admission to NICU at 36 weeks.  MOB appears very relaxed while breast feeding infant and states this is a good time to talk with her.  She reports that becoming a mother feels "great" and that she is  enjoying her time with her new baby.   MOB was open about her labor and delivery experience and states that things happened so quickly that it has been somewhat difficult to process.  MOB commented that her husband told her that he has "never felt so helpless" as he did during baby's delivery.  MOB concludes that all that matters is that she and baby are fine now.  CSW provided supportive brief therapy as MOB began to process her feelings.  She seems slightly discouraged at how baby is feeding, but appears understanding and accepting of his need to be in NICU at this time.  She appears to be coping well with the situation at this time.  She states feeling patient with the process and knows he will come home eventually.  MOB reports that she has a great support system and that her mother and husband will be able to help transport her to the hospital until she recovers from her c-section.  MOB states recovery is going well at this time and that she feels well, despite blood pressures that are "higher than they would like them to be."  She reports that they have all necessary baby supplies for infant at home and that they sent her mother to [employer] to get the last of the items they think they will need. CSW provided education regarding signs and symptoms of   perinatal mood disorders as well as common emotions often experienced in the weeks following delivery.  MOB states she has talked at length to friends who have experienced PPD.  She reports no history of mental illness and no current concerns about her emotions.  She states she feels comfortable talking with her doctor's office if symptoms arise.  CSW also offered for her to contact CSW if she has questions or concerns about the way she is feeling at any time.  MOB seemed appreciative.   CSW explained ongoing support services offered by NICU CSW and gave contact information.  CSW has no social concerns at this time.    CSW Plan/Description:  Patient/Family  Education , Psychosocial Support and Ongoing Assessment of Needs    Joseangel Nettleton Elizabeth, LCSW 10/03/2015, 3:51 PM  

## 2015-10-03 NOTE — Lactation Note (Signed)
This note was copied from the chart of Aimee Li. Lactation Consultation Note  Follow up visit made.  Mom states she is pumping a few mls every 3 hours.  Attempted to latch baby this AM but baby sleepy.  No questions or concerns at present.  Patient Name: Aimee Li [id number] Date: 10/03/2015     Maternal Data    Feeding Feeding Type: Breast Milk with Formula added Length of feed: 5 min  LATCH Score/Interventions Latch: Too sleepy or reluctant, no latch achieved, no sucking elicited.                    Lactation Tools Discussed/Used     Consult Status      Huston Foley 10/03/2015, 1:55 PM

## 2015-10-03 NOTE — Progress Notes (Signed)
Subjective: Postpartum Day 2: Cesarean Delivery Patient reports tolerating PO, + flatus and no problems voiding.  Baby in NICU, temperature maintenance  Objective: Vital signs in last 24 hours: Temp:  [97.6 F (36.4 C)-98.3 F (36.8 C)] 98.2 F (36.8 C) (01/19 0738) Pulse Rate:  [75-89] 76 (01/19 0738) Resp:  [16-18] 16 (01/19 0738) BP: (106-135)/(67-83) 135/75 mmHg (01/19 0738) Weight:  [250 lb 8 oz (113.626 kg)] 250 lb 8 oz (113.626 kg) (01/18 0908)  Physical Exam:  General: alert and cooperative Lochia: appropriate Uterine Fundus: firm Incision: scant old drainage noted on bandage DVT Evaluation: No evidence of DVT seen on physical exam. Negative Homan's sign. No cords or calf tenderness. Calf/Ankle edema is present. DTR's 2+   Recent Labs  10/01/15 1754 10/02/15 0545  HGB 13.9 13.5  HCT 40.5 40.2    Assessment/Plan: Status post Cesarean section. Postoperative course complicated by pre-e, improving  Continue current care.  CURTIS,CAROL G 10/03/2015, 8:50 AM

## 2015-10-03 NOTE — Progress Notes (Signed)
CSW attempted to meet with parents x 2 to introduce services, offer support, and complete assessment due to baby's admission to NICU.  On first attempt, parents were not in room.  On second attempt, parents had visitors.  CSW will attempt again at a later time.  CSW completed chart review and notes no social concerns. 

## 2015-10-04 LAB — CBC
HCT: 38.5 % (ref 36.0–46.0)
Hemoglobin: 13.1 g/dL (ref 12.0–15.0)
MCH: 33.7 pg (ref 26.0–34.0)
MCHC: 34 g/dL (ref 30.0–36.0)
MCV: 99 fL (ref 78.0–100.0)
PLATELETS: 186 10*3/uL (ref 150–400)
RBC: 3.89 MIL/uL (ref 3.87–5.11)
RDW: 13.8 % (ref 11.5–15.5)
WBC: 7.8 10*3/uL (ref 4.0–10.5)

## 2015-10-04 LAB — BASIC METABOLIC PANEL
Anion gap: 9 (ref 5–15)
BUN: 12 mg/dL (ref 6–20)
CALCIUM: 8.8 mg/dL — AB (ref 8.9–10.3)
CO2: 26 mmol/L (ref 22–32)
CREATININE: 0.71 mg/dL (ref 0.44–1.00)
Chloride: 103 mmol/L (ref 101–111)
Glucose, Bld: 74 mg/dL (ref 65–99)
Potassium: 4.2 mmol/L (ref 3.5–5.1)
SODIUM: 138 mmol/L (ref 135–145)

## 2015-10-04 LAB — LACTATE DEHYDROGENASE: LDH: 204 U/L — AB (ref 98–192)

## 2015-10-04 LAB — URIC ACID: URIC ACID, SERUM: 7.2 mg/dL — AB (ref 2.3–6.6)

## 2015-10-04 MED ORDER — LABETALOL HCL 200 MG PO TABS
200.0000 mg | ORAL_TABLET | Freq: Two times a day (BID) | ORAL | Status: DC
  Administered 2015-10-04 – 2015-10-05 (×2): 200 mg via ORAL
  Filled 2015-10-04 (×2): qty 1

## 2015-10-04 MED ORDER — LABETALOL HCL 100 MG PO TABS
100.0000 mg | ORAL_TABLET | Freq: Two times a day (BID) | ORAL | Status: DC
  Administered 2015-10-04: 100 mg via ORAL
  Filled 2015-10-04: qty 1

## 2015-10-04 NOTE — Progress Notes (Signed)
Subjective: Postpartum Day 3: Cesarean Delivery Patient reports tolerating PO, + flatus, + BM and no problems voiding.  Complains of temporal HA Baby stable in NICU Objective: Vital signs in last 24 hours: Temp:  [98.1 F (36.7 C)] 98.1 F (36.7 C) (01/19 2050) Pulse Rate:  [82-95] 82 (01/20 0005) Resp:  [14-20] 18 (01/20 0005) BP: (140-156)/(85-115) 140/85 mmHg (01/20 0005)  Physical Exam:  General: alert and cooperative Lochia: appropriate Uterine Fundus: firm Incision: healing well, honeycomb dressing reapplied DVT Evaluation: No evidence of DVT seen on physical exam. Negative Homan's sign. No cords or calf tenderness. Calf/Ankle edema is present.   Recent Labs  10/02/15 0545 10/04/15 0550  HGB 13.5 13.1  HCT 40.2 38.5    Assessment/Plan: Status post Cesarean section. Doing well postoperatively.  Labetalol 100 mg bid.  CURTIS,CAROL G 10/04/2015, 8:35 AM

## 2015-10-04 NOTE — Lactation Note (Signed)
This note was copied from the chart of Aimee Ellenora Talton. Lactation Consultation Note  Follow up visit made.  Mom is pleased she is pumping 20 mls of transitional milk.  Instructed mom to change to standard setting after one more pumping of .  Baby is also latching to breast some when not to sleepy.  Encouraged to call if assist needed.  Patient Name: Aimee Li [id number] Date: 10/04/2015     Maternal Data    Feeding    LATCH Score/Interventions                      Lactation Tools Discussed/Used     Consult Status      Huston Foley 10/04/2015, 2:13 PM

## 2015-10-04 NOTE — Progress Notes (Signed)
Pt up to NICU

## 2015-10-05 MED ORDER — LABETALOL HCL 200 MG PO TABS: 200.0000 mg | ORAL_TABLET | Freq: Two times a day (BID) | ORAL | Status: DC

## 2015-10-05 MED ORDER — IBUPROFEN 600 MG PO TABS: 600.0000 mg | ORAL_TABLET | Freq: Four times a day (QID) | ORAL | Status: DC

## 2015-10-05 MED ORDER — OXYCODONE-ACETAMINOPHEN 5-325 MG PO TABS: 1.0000 | ORAL_TABLET | ORAL | Status: DC | PRN

## 2015-10-05 NOTE — Progress Notes (Signed)
Subjective: Postpartum Day 4: Cesarean Delivery Patient reports tolerating PO, + flatus and no problems voiding.  No HA, visual disturbance, RUQ pain, CP/SOB.  Objective: Vital signs in last 24 hours: Temp:  [97.3 F (36.3 C)-98.5 F (36.9 C)] 98.5 F (36.9 C) (01/21 0537) Pulse Rate:  [85-102] 87 (01/21 0537) Resp:  [12-18] 16 (01/21 0537) BP: (122-148)/(85-100) 142/93 mmHg (01/21 0537) SpO2:  [97 %-100 %] 100 % (01/21 0537) Weight:  [107.539 kg (237 lb 1.3 oz)] 107.539 kg (237 lb 1.3 oz) (01/21 0537)  Physical Exam:  General: alert, cooperative and appears stated age 47: appropriate Uterine Fundus: firm Incision: healing well, no significant drainage, no dehiscence DVT Evaluation: No evidence of DVT seen on physical exam. Negative Homan's sign. No cords or calf tenderness.   Recent Labs  10/04/15 0550  HGB 13.1  HCT 38.5    Assessment/Plan: Status post Cesarean section. Doing well postoperatively.  Pre-eclampsia-asymptomatic, labs wnl.  Continue on labetalol 200 bid.  Present to office for incision and BP check 1 week pp.   Discharge home with standard precautions and return to clinic in 4-6 weeks.  Aimee Li 10/05/2015, 8:52 AM

## 2015-10-05 NOTE — Discharge Summary (Signed)
Obstetric Discharge Summary Reason for Admission: induction of labor Prenatal Procedures: none Intrapartum Procedures: cesarean: low cervical, transverse Postpartum Procedures: none Complications-Operative and Postpartum: none HEMOGLOBIN  Date Value Ref Range Status  10/04/2015 13.1 12.0 - 15.0 g/dL Final   HCT  Date Value Ref Range Status  10/04/2015 38.5 36.0 - 46.0 % Final    Physical Exam:  General: alert, cooperative and appears stated age Lochia: appropriate Uterine Fundus: firm Incision: healing well, no significant drainage, no dehiscence DVT Evaluation: No evidence of DVT seen on physical exam. Negative Homan's sign. No cords or calf tenderness.  Discharge Diagnoses: Term Pregnancy-delivered  Discharge Information: Date: 10/05/2015 Activity: pelvic rest Diet: routine Medications: PNV, Ibuprofen and Percocet Condition: stable Instructions: refer to practice specific booklet Discharge to: home   Newborn Data: Live born female  Birth Weight: 4 lb 6.4 oz (1995 g) APGAR: 7, 9  Home with NICU.  Kaymen Adrian 10/05/2015, 8:57 AM

## 2015-10-05 NOTE — Lactation Note (Signed)
This note was copied from the chart of Aimee Li. Lactation Consultation Note  Patient Name: Aimee Li [id number] Date: 10/05/2015 Reason for consult: Follow-up assessment;Infant < 6lbs;NICU baby   Follow up with mom of 51 hour old NICU Baby. Mom report infant is taking bottles of EBM and formula and that there is a possibility he may go home in next few days. She has attempted to BF infant and her prefers to go to sleep when at the breast. Enc her to practice STS and to offer infant breast as he will tolerate. Mom is pumping every 3 hours during day and said she did not pump once last night, enc her to take a 4-5 hour window at night for rest and to pump every 2-3 hours otherwise. She was pumping using hands free bra when I went into the room and had received 40 ml with this pumping. She reports she has been receiving 20-25 ml/pumping. She denies engorgement. Mom has a Engineer, structural DEBP for home use, enc her to use NICU pumps when visiting infant either in pumping rooms or at bedside. MOm has LC phone # and know she can call for questions/concerns/feeding assistance as needed.    Maternal Data Formula Feeding for Exclusion: No Does the patient have breastfeeding experience prior to this delivery?: No  Feeding Feeding Type: Formula Nipple Type: Slow - flow Length of feed: 30 min  LATCH Score/Interventions Latch: Too sleepy or reluctant, no latch achieved, no sucking elicited. Intervention(s): Teach feeding cues;Waking techniques Intervention(s): Assist with latch;Adjust position  Audible Swallowing: None Intervention(s): Hand expression  Type of Nipple: Flat  Comfort (Breast/Nipple): Soft / non-tender     Hold (Positioning): No assistance needed to correctly position infant at breast.  LATCH Score: 5  Lactation Tools Discussed/Used     Consult Status Consult Status: PRN Follow-up type: Call as needed    Ed Blalock 10/05/2015, 11:00 AM

## 2015-10-05 NOTE — Progress Notes (Signed)

## 2015-10-05 NOTE — Discharge Instructions (Signed)
Call MD for T>100.4, heavy vaginal bleeding, severe abdominal pain, intractable nausea and/or vomiting, or respiratory distress.  Also, closely monitor for HA, visual disturbance and RUQ pain.  Call office to schedule incision and BP check 1 weeks postpartum.  No driving while taking narcotics.  Pelvic rest x 6 weeks.

## 2015-10-08 ENCOUNTER — Ambulatory Visit: Payer: Self-pay

## 2015-10-08 NOTE — Lactation Note (Signed)
This note was copied from the chart of Aimee Li. Lactation Consultation Note  Mom and baby roomed in last night.  She is pumping 90 mls every 3 hours and bottle feeding baby.  Outpatient lactation appointment encouraged and mom states she has an appointment with LC at pediatrician.  Instructed to call office if we can be of any assistance.    Patient Name: Aimee Li [id number] Date: 10/08/2015     Maternal Data    Feeding Feeding Type: Breast Milk with Formula added Nipple Type: Slow - flow  LATCH Score/Interventions                      Lactation Tools Discussed/Used     Consult Status      Huston Foley 10/08/2015, 10:44 AM

## 2020-01-25 ENCOUNTER — Other Ambulatory Visit: Payer: Self-pay | Admitting: Obstetrics and Gynecology

## 2020-01-25 DIAGNOSIS — N979 Female infertility, unspecified: Secondary | ICD-10-CM

## 2020-02-01 ENCOUNTER — Ambulatory Visit
Admission: RE | Admit: 2020-02-01 | Discharge: 2020-02-01 | Disposition: A | Discharge status: Home / Self Care | Payer: Managed Care, Other (non HMO) | Source: Ambulatory Visit | Attending: Obstetrics and Gynecology | Admitting: Obstetrics and Gynecology

## 2020-02-01 DIAGNOSIS — N979 Female infertility, unspecified: Secondary | ICD-10-CM

## 2020-06-19 LAB — OB RESULTS CONSOLE GC/CHLAMYDIA
Chlamydia: NEGATIVE
Gonorrhea: NEGATIVE

## 2020-06-19 LAB — OB RESULTS CONSOLE ABO/RH: RH Type: POSITIVE

## 2020-06-19 LAB — OB RESULTS CONSOLE ANTIBODY SCREEN: Antibody Screen: NEGATIVE

## 2020-06-19 LAB — OB RESULTS CONSOLE HIV ANTIBODY (ROUTINE TESTING): HIV: NONREACTIVE

## 2020-06-19 LAB — OB RESULTS CONSOLE HEPATITIS B SURFACE ANTIGEN: Hepatitis B Surface Ag: NEGATIVE

## 2020-06-19 LAB — OB RESULTS CONSOLE RPR: RPR: NONREACTIVE

## 2020-06-19 LAB — OB RESULTS CONSOLE RUBELLA ANTIBODY, IGM: Rubella: IMMUNE

## 2020-12-27 ENCOUNTER — Encounter (HOSPITAL_COMMUNITY): Payer: Self-pay | Admitting: *Deleted

## 2020-12-27 ENCOUNTER — Encounter (HOSPITAL_COMMUNITY): Payer: Self-pay

## 2020-12-27 NOTE — Patient Instructions (Addendum)
Aimee Li  12/27/2020   Your procedure is scheduled on:  01/10/2021  Arrive at 0530 at Entrance C on [address] at [facility]  and [employer]. You are invited to use the FREE valet parking or use the Visitor's parking deck.  Pick up the phone at the desk and dial [phone].  Call this number if you have problems the morning of surgery: [phone]  Remember:   Do not eat food:(After Midnight) Desps de medianoche.  Do not drink clear liquids: (After Midnight) Desps de medianoche.  Take these medicines the morning of surgery with A SIP OF WATER:  Take levothyroxine as prescribed   Do not wear jewelry, make-up or nail polish.  Do not wear lotions, powders, or perfumes. Do not wear deodorant.  Do not shave 48 hours prior to surgery.  Do not bring valuables to the hospital.  [facility] is not   responsible for any belongings or valuables brought to the hospital.  Contacts, dentures or bridgework may not be worn into surgery.  Leave suitcase in the car. After surgery it may be brought to your room.  For patients admitted to the hospital, checkout time is 11:00 AM the day of              discharge.      Please read over the following fact sheets that you were given:     Preparing for Surgery

## 2021-01-07 NOTE — Anesthesia Preprocedure Evaluation (Addendum)
Anesthesia Evaluation  Patient identified by MRN, date of birth, ID band Patient awake    Reviewed: Allergy & Precautions, NPO status , Patient's Chart, lab work & pertinent test results  History of Anesthesia Complications Negative for: history of anesthetic complications  Airway Mallampati: II  TM Distance: >3 FB Neck ROM: Full    Dental no notable dental hx.    Pulmonary neg pulmonary ROS,    Pulmonary exam normal        Cardiovascular Normal cardiovascular exam     Neuro/Psych  Headaches, negative psych ROS   GI/Hepatic negative GI ROS, Neg liver ROS,   Endo/Other  Hypothyroidism Morbid obesity (BMI 42)  Renal/GU negative Renal ROS  negative genitourinary   Musculoskeletal negative musculoskeletal ROS (+)   Abdominal   Peds  Hematology negative hematology ROS (+)   Anesthesia Other Findings Day of surgery medications reviewed with patient.  Reproductive/Obstetrics (+) Pregnancy (Hx of C/S x1)                            Anesthesia Physical Anesthesia Plan  ASA: III  Anesthesia Plan: Spinal   Post-op Pain Management:    Induction:   PONV Risk Score and Plan: 4 or greater and Treatment may vary due to age or medical condition, Ondansetron and Dexamethasone  Airway Management Planned: Natural Airway  Additional Equipment: None  Intra-op Plan:   Post-operative Plan:   Informed Consent: I have reviewed the patients History and Physical, chart, labs and discussed the procedure including the risks, benefits and alternatives for the proposed anesthesia with the patient or authorized representative who has indicated his/her understanding and acceptance.       Plan Discussed with: CRNA  Anesthesia Plan Comments:        Anesthesia Quick Evaluation

## 2021-01-08 ENCOUNTER — Other Ambulatory Visit (HOSPITAL_COMMUNITY)
Admission: RE | Admit: 2021-01-08 | Discharge: 2021-01-08 | Disposition: A | Discharge status: Home / Self Care | Payer: Managed Care, Other (non HMO) | Source: Ambulatory Visit | Attending: Obstetrics and Gynecology | Admitting: Obstetrics and Gynecology

## 2021-01-08 ENCOUNTER — Other Ambulatory Visit: Payer: Self-pay

## 2021-01-08 ENCOUNTER — Encounter (HOSPITAL_COMMUNITY)
Admission: RE | Admit: 2021-01-08 | Discharge: 2021-01-08 | Disposition: A | Discharge status: Home / Self Care | Payer: Managed Care, Other (non HMO) | Source: Ambulatory Visit | Attending: Obstetrics and Gynecology | Admitting: Obstetrics and Gynecology

## 2021-01-08 DIAGNOSIS — Z01812 Encounter for preprocedural laboratory examination: Secondary | ICD-10-CM | POA: Insufficient documentation

## 2021-01-08 DIAGNOSIS — Z20822 Contact with and (suspected) exposure to covid-19: Secondary | ICD-10-CM | POA: Insufficient documentation

## 2021-01-08 LAB — CBC
HCT: 43 % (ref 36.0–46.0)
Hemoglobin: 14.2 g/dL (ref 12.0–15.0)
MCH: 32.3 pg (ref 26.0–34.0)
MCHC: 33 g/dL (ref 30.0–36.0)
MCV: 97.9 fL (ref 80.0–100.0)
Platelets: 224 10*3/uL (ref 150–400)
RBC: 4.39 MIL/uL (ref 3.87–5.11)
RDW: 13 % (ref 11.5–15.5)
WBC: 9.9 10*3/uL (ref 4.0–10.5)
nRBC: 0 % (ref 0.0–0.2)

## 2021-01-08 LAB — TYPE AND SCREEN
ABO/RH(D): A POS
Antibody Screen: NEGATIVE

## 2021-01-08 LAB — SARS CORONAVIRUS 2 (TAT 6-24 HRS): SARS Coronavirus 2: NEGATIVE

## 2021-01-09 LAB — RPR: RPR Ser Ql: NONREACTIVE

## 2021-01-10 ENCOUNTER — Inpatient Hospital Stay (HOSPITAL_COMMUNITY)
Admission: RE | Admit: 2021-01-10 | Discharge: 2021-01-12 | DRG: 785 | Disposition: A | Discharge status: Home / Self Care | Payer: Managed Care, Other (non HMO) | Attending: Obstetrics and Gynecology | Admitting: Obstetrics and Gynecology

## 2021-01-10 ENCOUNTER — Inpatient Hospital Stay (HOSPITAL_COMMUNITY): Payer: Managed Care, Other (non HMO) | Admitting: Anesthesiology

## 2021-01-10 ENCOUNTER — Other Ambulatory Visit: Payer: Self-pay

## 2021-01-10 ENCOUNTER — Encounter (HOSPITAL_COMMUNITY)
Admission: RE | Disposition: A | Discharge status: Home / Self Care | Payer: Self-pay | Source: Home / Self Care | Attending: Obstetrics and Gynecology

## 2021-01-10 ENCOUNTER — Encounter (HOSPITAL_COMMUNITY): Payer: Self-pay | Admitting: Obstetrics and Gynecology

## 2021-01-10 ENCOUNTER — Inpatient Hospital Stay (HOSPITAL_COMMUNITY)
Admission: RE | Admit: 2021-01-10 | Payer: Managed Care, Other (non HMO) | Source: Home / Self Care | Admitting: Obstetrics and Gynecology

## 2021-01-10 DIAGNOSIS — O34211 Maternal care for low transverse scar from previous cesarean delivery: Principal | ICD-10-CM | POA: Diagnosis present

## 2021-01-10 DIAGNOSIS — Z302 Encounter for sterilization: Secondary | ICD-10-CM | POA: Diagnosis not present

## 2021-01-10 DIAGNOSIS — Z3A39 39 weeks gestation of pregnancy: Secondary | ICD-10-CM

## 2021-01-10 DIAGNOSIS — O99214 Obesity complicating childbirth: Secondary | ICD-10-CM | POA: Diagnosis present

## 2021-01-10 DIAGNOSIS — Z20822 Contact with and (suspected) exposure to covid-19: Secondary | ICD-10-CM | POA: Diagnosis present

## 2021-01-10 SURGERY — Surgical Case
Anesthesia: Spinal | Laterality: Bilateral

## 2021-01-10 MED ORDER — KETOROLAC TROMETHAMINE 30 MG/ML IJ SOLN: 30.0000 mg | Freq: Four times a day (QID) | INTRAMUSCULAR | Status: AC | PRN

## 2021-01-10 MED ORDER — SIMETHICONE 80 MG PO CHEW: 80.0000 mg | CHEWABLE_TABLET | ORAL | Status: DC | PRN

## 2021-01-10 MED ORDER — PHENYLEPHRINE HCL-NACL 20-0.9 MG/250ML-% IV SOLN
INTRAVENOUS | Status: DC | PRN
  Administered 2021-01-10: 60 ug/min via INTRAVENOUS

## 2021-01-10 MED ORDER — ONDANSETRON HCL 4 MG/2ML IJ SOLN
INTRAMUSCULAR | Status: AC
  Filled 2021-01-10: qty 2

## 2021-01-10 MED ORDER — IBUPROFEN 600 MG PO TABS
600.0000 mg | ORAL_TABLET | Freq: Four times a day (QID) | ORAL | Status: DC
  Administered 2021-01-10 – 2021-01-12 (×8): 600 mg via ORAL
  Filled 2021-01-10 (×7): qty 1

## 2021-01-10 MED ORDER — BUPIVACAINE IN DEXTROSE 0.75-8.25 % IT SOLN
INTRATHECAL | Status: DC | PRN
  Administered 2021-01-10: 1.6 mL via INTRATHECAL

## 2021-01-10 MED ORDER — SODIUM CHLORIDE 0.9 % IV SOLN
2.0000 g | INTRAVENOUS | Status: AC
  Administered 2021-01-10: 2 g via INTRAVENOUS

## 2021-01-10 MED ORDER — PRENATAL MULTIVITAMIN CH
1.0000 | ORAL_TABLET | Freq: Every day | ORAL | Status: DC
  Administered 2021-01-11: 1 via ORAL
  Filled 2021-01-10: qty 1

## 2021-01-10 MED ORDER — SIMETHICONE 80 MG PO CHEW
80.0000 mg | CHEWABLE_TABLET | Freq: Three times a day (TID) | ORAL | Status: DC
  Administered 2021-01-10 – 2021-01-12 (×5): 80 mg via ORAL
  Filled 2021-01-10 (×5): qty 1

## 2021-01-10 MED ORDER — DIPHENHYDRAMINE HCL 25 MG PO CAPS: 25.0000 mg | ORAL_CAPSULE | Freq: Four times a day (QID) | ORAL | Status: DC | PRN

## 2021-01-10 MED ORDER — WITCH HAZEL-GLYCERIN EX PADS: 1.0000 "application " | MEDICATED_PAD | CUTANEOUS | Status: DC | PRN

## 2021-01-10 MED ORDER — DEXAMETHASONE SODIUM PHOSPHATE 4 MG/ML IJ SOLN
INTRAMUSCULAR | Status: AC
  Filled 2021-01-10: qty 2

## 2021-01-10 MED ORDER — PHENYLEPHRINE HCL-NACL 20-0.9 MG/250ML-% IV SOLN
INTRAVENOUS | Status: AC
  Filled 2021-01-10: qty 250

## 2021-01-10 MED ORDER — COCONUT OIL OIL
1.0000 "application " | TOPICAL_OIL | Status: DC | PRN
  Administered 2021-01-12: 1 via TOPICAL

## 2021-01-10 MED ORDER — FENTANYL CITRATE (PF) 100 MCG/2ML IJ SOLN
INTRAMUSCULAR | Status: DC | PRN
  Administered 2021-01-10: 15 ug via INTRATHECAL

## 2021-01-10 MED ORDER — MORPHINE SULFATE (PF) 0.5 MG/ML IJ SOLN
INTRAMUSCULAR | Status: AC
  Filled 2021-01-10: qty 10

## 2021-01-10 MED ORDER — MENTHOL 3 MG MT LOZG: 1.0000 | LOZENGE | OROMUCOSAL | Status: DC | PRN

## 2021-01-10 MED ORDER — ONDANSETRON HCL 4 MG/2ML IJ SOLN
INTRAMUSCULAR | Status: DC | PRN
  Administered 2021-01-10: 4 mg via INTRAVENOUS

## 2021-01-10 MED ORDER — LACTATED RINGERS IV SOLN: INTRAVENOUS | Status: DC

## 2021-01-10 MED ORDER — PHENYLEPHRINE 40 MCG/ML (10ML) SYRINGE FOR IV PUSH (FOR BLOOD PRESSURE SUPPORT)
PREFILLED_SYRINGE | INTRAVENOUS | Status: AC
  Filled 2021-01-10: qty 10

## 2021-01-10 MED ORDER — KETOROLAC TROMETHAMINE 30 MG/ML IJ SOLN: 30.0000 mg | Freq: Once | INTRAMUSCULAR | Status: DC

## 2021-01-10 MED ORDER — ZOLPIDEM TARTRATE 5 MG PO TABS: 5.0000 mg | ORAL_TABLET | Freq: Every evening | ORAL | Status: DC | PRN

## 2021-01-10 MED ORDER — OXYTOCIN-SODIUM CHLORIDE 30-0.9 UT/500ML-% IV SOLN
INTRAVENOUS | Status: AC
  Filled 2021-01-10: qty 500

## 2021-01-10 MED ORDER — MORPHINE SULFATE (PF) 0.5 MG/ML IJ SOLN
INTRAMUSCULAR | Status: DC | PRN
  Administered 2021-01-10: 150 ug via INTRATHECAL

## 2021-01-10 MED ORDER — SODIUM CHLORIDE 0.9 % IV SOLN
INTRAVENOUS | Status: AC
  Filled 2021-01-10: qty 2

## 2021-01-10 MED ORDER — OXYTOCIN-SODIUM CHLORIDE 30-0.9 UT/500ML-% IV SOLN
INTRAVENOUS | Status: DC | PRN
  Administered 2021-01-10: 300 mL via INTRAVENOUS

## 2021-01-10 MED ORDER — LACTATED RINGERS IV SOLN: INTRAVENOUS | Status: DC | PRN

## 2021-01-10 MED ORDER — SENNOSIDES-DOCUSATE SODIUM 8.6-50 MG PO TABS
2.0000 | ORAL_TABLET | Freq: Every day | ORAL | Status: DC
  Administered 2021-01-11 – 2021-01-12 (×2): 2 via ORAL
  Filled 2021-01-10 (×2): qty 2

## 2021-01-10 MED ORDER — METOCLOPRAMIDE HCL 5 MG/ML IJ SOLN
INTRAMUSCULAR | Status: DC | PRN
  Administered 2021-01-10: 10 mg via INTRAVENOUS

## 2021-01-10 MED ORDER — FENTANYL CITRATE (PF) 100 MCG/2ML IJ SOLN: 25.0000 ug | INTRAMUSCULAR | Status: DC | PRN

## 2021-01-10 MED ORDER — TETANUS-DIPHTH-ACELL PERTUSSIS 5-2.5-18.5 LF-MCG/0.5 IM SUSY: 0.5000 mL | PREFILLED_SYRINGE | Freq: Once | INTRAMUSCULAR | Status: DC

## 2021-01-10 MED ORDER — PHENYLEPHRINE HCL (PRESSORS) 10 MG/ML IV SOLN
INTRAVENOUS | Status: DC | PRN
  Administered 2021-01-10: 80 ug via INTRAVENOUS
  Administered 2021-01-10: 120 ug via INTRAVENOUS

## 2021-01-10 MED ORDER — ACETAMINOPHEN 500 MG PO TABS: 1000.0000 mg | ORAL_TABLET | Freq: Once | ORAL | Status: DC

## 2021-01-10 MED ORDER — PROMETHAZINE HCL 25 MG/ML IJ SOLN: 6.2500 mg | INTRAMUSCULAR | Status: DC | PRN

## 2021-01-10 MED ORDER — DEXAMETHASONE SODIUM PHOSPHATE 4 MG/ML IJ SOLN
INTRAMUSCULAR | Status: DC | PRN
  Administered 2021-01-10: 8 mg via INTRAVENOUS

## 2021-01-10 MED ORDER — OXYCODONE HCL 5 MG PO TABS
5.0000 mg | ORAL_TABLET | ORAL | Status: DC | PRN
  Administered 2021-01-12: 5 mg via ORAL
  Filled 2021-01-10: qty 1

## 2021-01-10 MED ORDER — OXYTOCIN-SODIUM CHLORIDE 30-0.9 UT/500ML-% IV SOLN
2.5000 [IU]/h | INTRAVENOUS | Status: AC
  Administered 2021-01-10: 2.5 [IU]/h via INTRAVENOUS
  Filled 2021-01-10: qty 500

## 2021-01-10 MED ORDER — FENTANYL CITRATE (PF) 100 MCG/2ML IJ SOLN
INTRAMUSCULAR | Status: AC
  Filled 2021-01-10: qty 2

## 2021-01-10 MED ORDER — STERILE WATER FOR IRRIGATION IR SOLN
Status: DC | PRN
  Administered 2021-01-10: 1

## 2021-01-10 MED ORDER — SODIUM CHLORIDE 0.9 % IR SOLN
Status: DC | PRN
  Administered 2021-01-10: 1

## 2021-01-10 MED ORDER — METOCLOPRAMIDE HCL 5 MG/ML IJ SOLN
INTRAMUSCULAR | Status: AC
  Filled 2021-01-10: qty 2

## 2021-01-10 MED ORDER — ACETAMINOPHEN 160 MG/5ML PO SOLN: 1000.0000 mg | Freq: Once | ORAL | Status: DC

## 2021-01-10 MED ORDER — DIBUCAINE (PERIANAL) 1 % EX OINT: 1.0000 "application " | TOPICAL_OINTMENT | CUTANEOUS | Status: DC | PRN

## 2021-01-10 MED ORDER — LEVOTHYROXINE SODIUM 50 MCG PO TABS
50.0000 ug | ORAL_TABLET | Freq: Every day | ORAL | Status: DC
  Administered 2021-01-11 – 2021-01-12 (×2): 50 ug via ORAL
  Filled 2021-01-10 (×2): qty 1

## 2021-01-10 MED ORDER — POVIDONE-IODINE 10 % EX SWAB: 2.0000 "application " | Freq: Once | CUTANEOUS | Status: DC

## 2021-01-10 SURGICAL SUPPLY — 32 items
BARRIER ADHS 3X4 INTERCEED (GAUZE/BANDAGES/DRESSINGS) IMPLANT
BENZOIN TINCTURE PRP APPL 2/3 (GAUZE/BANDAGES/DRESSINGS) ×2 IMPLANT
CLAMP CORD UMBIL (MISCELLANEOUS) IMPLANT
CLIP FILSHIE TUBAL LIGA STRL (Clip) ×2 IMPLANT
CLOTH BEACON ORANGE TIMEOUT ST (SAFETY) ×2 IMPLANT
DRSG OPSITE POSTOP 4X10 (GAUZE/BANDAGES/DRESSINGS) ×2 IMPLANT
ELECT REM PT RETURN 9FT ADLT (ELECTROSURGICAL) ×2
ELECTRODE REM PT RTRN 9FT ADLT (ELECTROSURGICAL) ×1 IMPLANT
EXTRACTOR VACUUM M CUP 4 TUBE (SUCTIONS) IMPLANT
GLOVE BIO SURGEON STRL SZ 6.5 (GLOVE) ×2 IMPLANT
GLOVE BIOGEL PI IND STRL 7.0 (GLOVE) ×1 IMPLANT
GLOVE BIOGEL PI INDICATOR 7.0 (GLOVE) ×1
GOWN STRL REUS W/TWL LRG LVL3 (GOWN DISPOSABLE) ×4 IMPLANT
HOVERMATT SINGLE USE (MISCELLANEOUS) ×2 IMPLANT
KIT ABG SYR 3ML LUER SLIP (SYRINGE) IMPLANT
NEEDLE HYPO 22GX1.5 SAFETY (NEEDLE) IMPLANT
NEEDLE HYPO 25X5/8 SAFETYGLIDE (NEEDLE) ×2 IMPLANT
NS IRRIG 1000ML POUR BTL (IV SOLUTION) ×2 IMPLANT
PACK C SECTION WH (CUSTOM PROCEDURE TRAY) ×2 IMPLANT
PAD OB MATERNITY 4.3X12.25 (PERSONAL CARE ITEMS) ×2 IMPLANT
PENCIL SMOKE EVAC W/HOLSTER (ELECTROSURGICAL) ×2 IMPLANT
STRIP CLOSURE SKIN 1/2X4 (GAUZE/BANDAGES/DRESSINGS) ×2 IMPLANT
SUT CHROMIC 0 CTX 36 (SUTURE) ×4 IMPLANT
SUT PLAIN 0 NONE (SUTURE) IMPLANT
SUT PLAIN 2 0 XLH (SUTURE) ×2 IMPLANT
SUT VIC AB 0 CT1 27 (SUTURE) ×6
SUT VIC AB 0 CT1 27XBRD ANBCTR (SUTURE) ×3 IMPLANT
SUT VIC AB 4-0 KS 27 (SUTURE) IMPLANT
SYR CONTROL 10ML LL (SYRINGE) IMPLANT
TOWEL OR 17X24 6PK STRL BLUE (TOWEL DISPOSABLE) ×2 IMPLANT
TRAY FOLEY W/BAG SLVR 14FR LF (SET/KITS/TRAYS/PACK) ×2 IMPLANT
WATER STERILE IRR 1000ML POUR (IV SOLUTION) ×2 IMPLANT

## 2021-01-10 NOTE — Brief Op Note (Signed)
01/10/2021  8:33 AM  PATIENT:  Claud Kelp  35 y.o. female  PRE-OPERATIVE DIAGNOSIS:  IUP at 39 weeks Previous Cesarean Section Desires Permanent sterilization POST-OPERATIVE DIAGNOSIS Same  PROCEDURE:  Procedure(s): REPEAT CESAREAN SECTION WITH BILATERAL TUBAL LIGATION EDC: 01-17-21  ALLERG: TOPAMAX (Bilateral)  SURGEON:  Surgeon(s) and Role:    * Marcelle Overlie, MD - Primary  PHYSICIAN ASSISTANT:   ASSISTANTS: none   ANESTHESIA:   spinal  EBL per anesthesia record  BLOOD ADMINISTERED:none  DRAINS: Urinary Catheter (Foley)   LOCAL MEDICATIONS USED:  NONE  SPECIMEN:  No Specimen  DISPOSITION OF SPECIMEN:  N/A  COUNTS:  YES  TOURNIQUET:  * No tourniquets in log *  DICTATION: .Other Dictation: Dictation Number dictated  PLAN OF CARE: Admit to inpatient   PATIENT DISPOSITION:  PACU - hemodynamically stable.   Delay start of Pharmacological VTE agent (>24hrs) due to surgical blood loss or risk of bleeding: not applicable

## 2021-01-10 NOTE — Lactation Note (Signed)
This note was copied from a baby's chart. Lactation Consultation Note  Patient Name: Aimee Li AJOIN'O Date: 01/10/2021 Reason for consult: Term;Other (Comment);Maternal endocrine disorder;Initial assessment (C/S mom with hx hypothyroidism) Age:34 hours P2, term female infant. Per mom, infant had 2 stools since birth. Per mom, infant is latching well and she is excited her daughter is latching well,  son never latched well was in NICU, so  she pumped only  6 months with her son. Mom has DEBP at home. LC entered room, mom starting latch infant on her right breast using the cross cradle hold, infant latched with depth and swallows could be heard, " cuh" sound. Infant breastfeed for 15 minutes. LC discussed hand expression and infant was given 4 mls of colostrum by spoon.  Mom knows to breastfeed infant according to cues, 8 to 12 or more times within 24 hours, STS. Mom knows to call RN or [facility] services if she has breastfeeding questoins, concerns or need assistance with latching infant at the breast. LC discussed infant's input and output with mom. Mom made aware of O/P services, breastfeeding support groups, community resources, and our phone # for post-discharge questions.   Maternal Data Has patient been taught Hand Expression?: Yes Does the patient have breastfeeding experience prior to this delivery?: Yes How long did the patient breastfeed?: Per mom, her son was premature and in NICU he never latched well even with NS, she pumped for 6 months, 1st child is now 33 years old.  Feeding Mother's Current Feeding Choice: Breast Milk  LATCH Score Latch: Grasps breast easily, tongue down, lips flanged, rhythmical sucking.  Audible Swallowing: Spontaneous and intermittent  Type of Nipple: Everted at rest and after stimulation  Comfort (Breast/Nipple): Soft / non-tender  Hold (Positioning): Assistance needed to correctly position infant at breast and maintain latch.  LATCH Score:  9   Lactation Tools Discussed/Used    Interventions Interventions: Breast feeding basics reviewed;Assisted with latch;Skin to skin;Hand express;Breast compression;Adjust position;Support pillows;Position options;Expressed milk;Education  Discharge Pump: Personal WIC Program: No  Consult Status Consult Status: Follow-up Date: 01/11/21 Follow-up type: In-patient    Danelle Earthly 01/10/2021, 6:03 PM

## 2021-01-10 NOTE — Anesthesia Procedure Notes (Signed)
Spinal  Patient location during procedure: OR Start time: 01/10/2021 7:42 AM End time: 01/10/2021 7:45 AM Reason for block: surgical anesthesia Staffing Performed: anesthesiologist  Anesthesiologist: Kaylyn Layer, MD Preanesthetic Checklist Completed: patient identified, IV checked, risks and benefits discussed, monitors and equipment checked, pre-op evaluation and timeout performed Spinal Block Patient position: sitting Prep: DuraPrep and site prepped and draped Patient monitoring: heart rate, continuous pulse ox and blood pressure Approach: midline Location: L3-4 Injection technique: single-shot Needle Needle type: Pencan  Needle gauge: 24 G Needle length: 10 cm Assessment Sensory level: T4 Events: CSF return Additional Notes Risks, benefits, and alternative discussed. Patient gave consent to procedure. Prepped and draped in sitting position. Clear CSF obtained after one needle redirection. Positive terminal aspiration. No pain or paraesthesias with injection. Patient tolerated procedure well. Vital signs stable. Amalia Greenhouse, MD

## 2021-01-10 NOTE — Transfer of Care (Signed)
Immediate Anesthesia Transfer of Care Note  Patient: Aimee Li  Procedure(s) Performed: REPEAT CESAREAN SECTION WITH BILATERAL TUBAL LIGATION EDC: 01-17-21  ALLERG: TOPAMAX (Bilateral )  Patient Location: PACU  Anesthesia Type:Spinal  Level of Consciousness: awake, alert  and oriented  Airway & Oxygen Therapy: Patient Spontanous Breathing  Post-op Assessment: Report given to RN and Post -op Vital signs reviewed and stable  Post vital signs: Reviewed and stable  Last Vitals:  Vitals Value Taken Time  BP 102/55 01/10/21 0849  Temp    Pulse 89 01/10/21 0852  Resp 18 01/10/21 0852  SpO2 97 % 01/10/21 0852  Vitals shown include unvalidated device data.  Last Pain:  Vitals:   01/10/21 0549  TempSrc:   PainSc: 0-No pain         Complications: No complications documented.

## 2021-01-10 NOTE — H&P (Signed)
Aimee Li is a 35 y.o.G 2 P 0101 at 39 weeks presents for repeat C Section and BTL No concerns OB History    Gravida  2   Para  1   Term      Preterm  1   AB      Living  1     SAB      IAB      Ectopic      Multiple  0   Live Births  1          Past Medical History:  Diagnosis Date  . Headache    migraines   Past Surgical History:  Procedure Laterality Date  . CESAREAN SECTION N/A 10/01/2015   Procedure: CESAREAN SECTION;  Surgeon: Marcelle Overlie, MD;  Location: [facility];  Service: Obstetrics;  Laterality: N/A;  . WISDOM TOOTH EXTRACTION     Family History: family history is not on file. Social History:  reports that she has never smoked. She has never used smokeless tobacco. She reports that she does not drink alcohol and does not use drugs.     Maternal Diabetes: No Genetic Screening: Normal Maternal Ultrasounds/Referrals: Normal Fetal Ultrasounds or other Referrals:  None Maternal Substance Abuse:  No Significant Maternal Medications:  None Significant Maternal Lab Results:  None Other Comments:  None  Review of Systems  All other systems reviewed and are negative.  History   Blood pressure 120/78, pulse 86, temperature 98.5 F (36.9 C), temperature source Oral, resp. rate 16, height 5\' 4"  (1.626 m), weight 113.4 kg, SpO2 100 %, unknown if currently breastfeeding. Maternal Exam:  Abdomen: Fetal presentation: vertex     Physical Exam Vitals and nursing note reviewed. Exam conducted with a chaperone present.  Constitutional:      Appearance: Normal appearance.  Cardiovascular:     Rate and Rhythm: Regular rhythm. Tachycardia present.  Neurological:     Mental Status: She is alert.     Prenatal labs: ABO, Rh: --/--/A POS (04/27 01-20-1974) Antibody: NEG (04/27 01-20-1974) Rubella: Immune (10/06 0000) RPR: NON REACTIVE (04/27 0930)  HBsAg: Negative (10/06 0000)  HIV: Non-reactive (10/06 0000)  GBS:     Assessment/Plan: IUP at 39  weeks Previous C Section Desires Permanent sterilization  Repeat LTCS BTL Risks reviewed Consent is signed   [birth year]1997 01/10/2021, 7:05 AM

## 2021-01-10 NOTE — Anesthesia Postprocedure Evaluation (Signed)
Anesthesia Post Note  Patient: Aimee Li  Procedure(s) Performed: REPEAT CESAREAN SECTION WITH BILATERAL TUBAL LIGATION EDC: 01-17-21  ALLERG: TOPAMAX (Bilateral )     Patient location during evaluation: PACU Anesthesia Type: Spinal Level of consciousness: awake and alert and oriented Pain management: pain level controlled Vital Signs Assessment: post-procedure vital signs reviewed and stable Respiratory status: spontaneous breathing, nonlabored ventilation and respiratory function stable Cardiovascular status: blood pressure returned to baseline Postop Assessment: no apparent nausea or vomiting, spinal receding, no headache and no backache Anesthetic complications: no   No complications documented.  Last Vitals:  Vitals:   01/10/21 0922 01/10/21 0923  BP:    Pulse: 78 77  Resp: 16 14  Temp:    SpO2: 99% 99%    Last Pain:  Vitals:   01/10/21 0915  TempSrc:   PainSc: 0-No pain            Kaylyn Layer

## 2021-01-11 LAB — CBC
HCT: 35.7 % — ABNORMAL LOW (ref 36.0–46.0)
Hemoglobin: 11.8 g/dL — ABNORMAL LOW (ref 12.0–15.0)
MCH: 32.2 pg (ref 26.0–34.0)
MCHC: 33.1 g/dL (ref 30.0–36.0)
MCV: 97.5 fL (ref 80.0–100.0)
Platelets: 155 10*3/uL (ref 150–400)
RBC: 3.66 MIL/uL — ABNORMAL LOW (ref 3.87–5.11)
RDW: 13.1 % (ref 11.5–15.5)
WBC: 12.5 10*3/uL — ABNORMAL HIGH (ref 4.0–10.5)
nRBC: 0 % (ref 0.0–0.2)

## 2021-01-11 NOTE — Progress Notes (Signed)
Subjective: Postpartum Day 1: Cesarean Delivery Patient reports incisional pain, tolerating PO and no problems voiding.    Objective: Vital signs in last 24 hours: Temp:  [97.7 F (36.5 C)-98.3 F (36.8 C)] 98.3 F (36.8 C) (04/30 0300) Pulse Rate:  [62-82] 71 (04/30 0300) Resp:  [14-20] 18 (04/30 0300) BP: (109-127)/(73-89) 112/89 (04/30 0300) SpO2:  [97 %-100 %] 99 % (04/29 1531)  Physical Exam:  General: alert, cooperative, appears stated age and no distress Lochia: appropriate Uterine Fundus: firm Incision: healing well DVT Evaluation: No evidence of DVT seen on physical exam.  Recent Labs    01/11/21 0459  HGB 11.8*  HCT 35.7*    Assessment/Plan: Status post Cesarean section. Doing well postoperatively.  Continue current care.  Aimee Li 01/11/2021, 9:40 AM

## 2021-01-11 NOTE — Lactation Note (Signed)
This note was copied from a baby's chart. Lactation Consultation Note  Patient Name: Aimee Li [id number] Date: 01/11/2021 Reason for consult: Follow-up assessment Age:34 hours   P2, First child in NICU.  Mother requested assistance with latching. Mother hand expressed good flow and independently latched baby.  LC provided pillows for support. Noted intermittent swallows.  Praised mother for her efforts. Discussed cluster feeding.  Suggest calling if further assistance is needed.  Maternal Data Has patient been taught Hand Expression?: Yes   LATCH Score Latch: Grasps breast easily, tongue down, lips flanged, rhythmical sucking.  Audible Swallowing: A few with stimulation  Type of Nipple: Everted at rest and after stimulation  Comfort (Breast/Nipple): Soft / non-tender  Hold (Positioning): Assistance needed to correctly position infant at breast and maintain latch.  LATCH Score: 8   Interventions Interventions: Support pillows;Hand express;Education   Consult Status Consult Status: Follow-up Date: 01/12/21 Follow-up type: In-patient    Dahlia Byes [facility] 01/11/2021, 1:54 PM

## 2021-01-11 NOTE — Op Note (Signed)
NAME: Aimee Li, Aimee Li MEDICAL RECORD NO: [id number] ACCOUNT NO: [id number] DATE OF BIRTH: 1985-12-22 FACILITY: [facility] LOCATION: [facility] PHYSICIAN: Marcelino Duster L. Vincente Poli, MD  Operative Report   DATE OF PROCEDURE: 01/10/2021  PREOPERATIVE DIAGNOSES: 1.  Intrauterine pregnancy at 39 weeks. 2.  Previous cesarean section. 3.  Desires permanent sterilization.  POSTOPERATIVE DIAGNOSES: 1.  Intrauterine pregnancy at 39 weeks. 2.  Previous cesarean section. 3.  Desires permanent sterilization.  PROCEDURE:  Repeat low transverse cesarean section and bilateral tubal ligation with placement of Filshie clips.  SURGEON:  Marcelle Overlie, MD  ANESTHESIA:  Spinal.  ESTIMATED BLOOD LOSS:  Less than 500.  COMPLICATIONS:  None.  DRAINS:  Foley catheter.  DESCRIPTION OF PROCEDURE:  The patient was taken to the operating room.  Her spinal was placed.  She was then prepped and draped in the usual sterile fashion.  A low transverse incision was made in the skin with a scalpel and carried down to the fascia.   Fascia was scored in the midline, extended laterally.  Rectus muscles were separated in the midline.  The peritoneum was entered bluntly.  The peritoneal incision was then stretched.  The lower uterine segment was identified.  The bladder flap was  created sharply and then digitally.  The bladder blade was then readjusted.  A low transverse incision was made in the uterus using a hemostat and scalpel and amniotic fluid was clear.  The baby was delivered easily with the vacuum extractor and was a  female infant, Apgars of 8 at 1 minute and 9 at 5 minutes.  The cord was clamped and cut.  The baby was handed to the waiting neonatal team.  The placenta was manually removed, noted to be normal, intact with a 3-vessel cord.  The uterus was cleared of  all clots and debris.  Uterine incision was closed using one layer of 0 chromic in a running stitch.  We then placed Filshie clips across each midportion of the  fallopian tube for bilateral tubal ligation.  The tubes and ovaries appeared to be normal.   Hemostasis was noted.  Irrigation was performed.  Peritoneum was closed using 0 Vicryl.  The fascia was closed using 0 Vicryl in a running stitch starting at each corner and meeting in the midline.  The subcutaneous was closed with plain gut suture  interrupted.  The skin was closed with 3-0 Vicryl on a Keith needle.  All sponge, lap and instrument counts were correct x 2.  The patient went to recovery room in stable condition.   SHW D: 01/10/2021 1:44:00 pm T: 01/11/2021 3:43:00 am  JOB: [id number]/ [id number]

## 2021-01-12 MED ORDER — IBUPROFEN 600 MG PO TABS: 600.0000 mg | ORAL_TABLET | Freq: Four times a day (QID) | ORAL | 0 refills | Status: AC | PRN

## 2021-01-12 NOTE — Discharge Summary (Signed)
Postpartum Discharge Summary       Patient Name: Aimee Li DOB: 09/06/1986 MRN: [id number]  Date of admission: 01/10/2021 Delivery date:01/10/2021  Delivering provider: Dian Queen  Date of discharge: 01/12/2021  Admitting diagnosis: S/P cesarean section [Z98.891] Pregnancy [Z34.90] Intrauterine pregnancy: [redacted]w[redacted]d    Secondary diagnosis:  Active Problems:   * No active hospital problems. *  Additional problems:       Discharge diagnosis: Term Pregnancy Delivered                                              Post partum procedures:  Augmentation: N/A Complications: None  Hospital course: Sceduled C/S   35y.o. yo G2P1102 at 320w0das admitted to the hospital 01/10/2021 for scheduled cesarean section with the following indication:Elective Repeat.Delivery details are as follows:  Membrane Rupture Time/Date: 8:06 AM ,01/10/2021   Delivery Method:C-Section, Vacuum Assisted  Details of operation can be found in separate operative note.  Patient had an uncomplicated postpartum course.  She is ambulating, tolerating a regular diet, passing flatus, and urinating well. Patient is discharged home in stable condition on  01/12/21        Newborn Data: Birth date:01/10/2021  Birth time:8:09 AM  Gender:Female  Living status:Living  Apgars:8 ,9  Weight:3290 g     Magnesium Sulfate received: No BMZ received: No Rhophylac:N/A MMR:N/A T-DaP:Given prenatally Flu: Yes Transfusion:No  Physical exam  Vitals:   01/11/21 0300 01/11/21 1653 01/11/21 2355 01/12/21 0543  BP: 112/89 117/81 (!) 140/98 (!) 146/96  Pulse: 71 86 89 83  Resp: 18 20 19 17   Temp: 98.3 F (36.8 C) 98.4 F (36.9 C) 98.2 F (36.8 C) 98.4 F (36.9 C)  TempSrc: Oral  Oral Oral  SpO2:   96%   Weight:      Height:       General: alert, cooperative and no distress Lochia: appropriate Uterine Fundus: firm Incision: Healing well with no significant drainage DVT Evaluation: No evidence of DVT seen on  physical exam. Labs: Lab Results  Component Value Date   WBC 12.5 (H) 01/11/2021   HGB 11.8 (L) 01/11/2021   HCT 35.7 (L) 01/11/2021   MCV 97.5 01/11/2021   PLT 155 01/11/2021   CMP Latest Ref Rng & Units 10/04/2015  Glucose 65 - 99 mg/dL 74  BUN 6 - 20 mg/dL 12  Creatinine 0.44 - 1.00 mg/dL 0.71  Sodium 135 - 145 mmol/L 138  Potassium 3.5 - 5.1 mmol/L 4.2  Chloride 101 - 111 mmol/L 103  CO2 22 - 32 mmol/L 26  Calcium 8.9 - 10.3 mg/dL 8.8(L)  Total Protein 6.5 - 8.1 g/dL -  Total Bilirubin 0.3 - 1.2 mg/dL -  Alkaline Phos 38 - 126 U/L -  AST 15 - 41 U/L -  ALT 14 - 54 U/L -   Edinburgh Score: Edinburgh Postnatal Depression Scale Screening Tool 01/10/2021  I have been able to laugh and see the funny side of things. 0  I have looked forward with enjoyment to things. 0  I have blamed myself unnecessarily when things went wrong. 0  I have been anxious or worried for no good reason. 0  I have felt scared or panicky for no good reason. 0  Things have been getting on top of me. 0  I have been so unhappy that I have  had difficulty sleeping. 0  I have felt sad or miserable. 0  I have been so unhappy that I have been crying. 0  The thought of harming myself has occurred to me. 0  Edinburgh Postnatal Depression Scale Total 0      After visit meds:  Allergies as of 01/12/2021      Reactions   Topamax [topiramate] Rash   (facial)      Medication List    TAKE these medications   erythromycin with ethanol 2 % gel Commonly known as: EMGEL Apply 1 application topically in the morning and at bedtime.   ibuprofen 600 MG tablet Commonly known as: ADVIL Take 1 tablet (600 mg total) by mouth every 6 (six) hours as needed.   levothyroxine 50 MCG tablet Commonly known as: SYNTHROID Take 50 mcg by mouth daily before breakfast.   prenatal multivitamin Tabs tablet Take 1 tablet by mouth in the morning.        Discharge home in stable condition Infant Feeding: Breast Infant  Disposition:home with mother Discharge instruction: per After Visit Summary and Postpartum booklet. Activity: Advance as tolerated. Pelvic rest for 6 weeks.  Diet: routine diet Anticipated Birth Control: BTL done PP Postpartum Appointment:6 weeks Additional Postpartum F/U:   Future Appointments:No future appointments. Follow up Visit:      01/12/2021 Luz Lex, MD

## 2021-01-12 NOTE — Lactation Note (Signed)
This note was copied from a baby's chart. Lactation Consultation Note  Patient Name: Aimee Li OLMBE'M Date: 01/12/2021 Reason for consult: Follow-up assessment Age:34 hours   P1, Baby 9% weight loss.  3.65% in the last 24 hours 3 voids/3 stools. Mother has good flow of colostrum and has been spoon feeding in addition to breastfeeding. Reviewed engorgement care and monitoring voids/stools. Suggest pumping when home if weight loss continues to help stabilize.   Interventions Interventions: Education  Discharge Discharge Education: Engorgement and breast care;Warning signs for feeding baby Pump: DEBP  Consult Status Consult Status: Complete Date: 01/12/21    Dahlia Byes [facility] 01/12/2021, 8:22 AM

## 2021-01-12 NOTE — Progress Notes (Signed)
Dr Rana Snare called concerning B/P 140/101    No other signs noted  Just gave her pain pill   Dr Rana Snare instr to recheck in 30 min and instr pt to lay on left side

## 2021-02-07 IMAGING — RF DG HYSTEROGRAM
1 series · 7 of 7 positions shown · IV contrast (omnipaque)
Comparison: None.

CLINICAL DATA: Secondary female infertility.

EXAM:
HYSTEROSALPINGOGRAM
TECHNIQUE: Following cleansing of the cervix and vagina with Betadine solution,
a hysterosalpingogram was performed using a 5-French
hysterosalpingogram catheter and Omnipaque 300 contrast. The patient
tolerated the examination without difficulty.

[Series 1: one shot · 0.14mm/px · 7 of 7 slices shown]
[im 1/7]
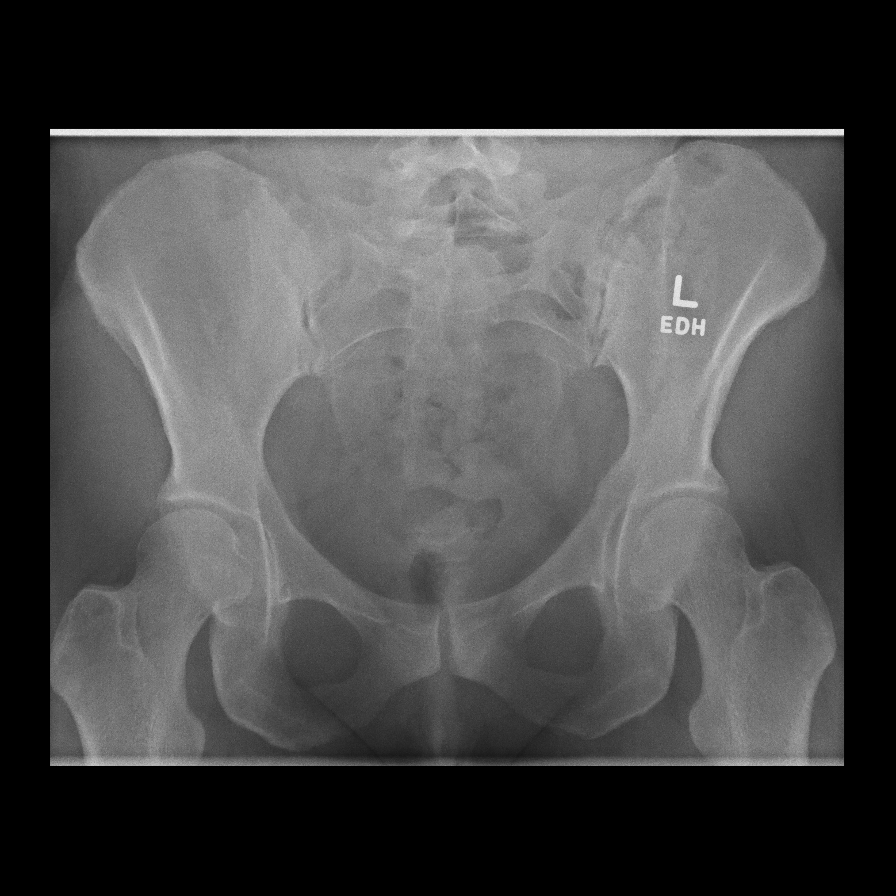
[im 2/7]
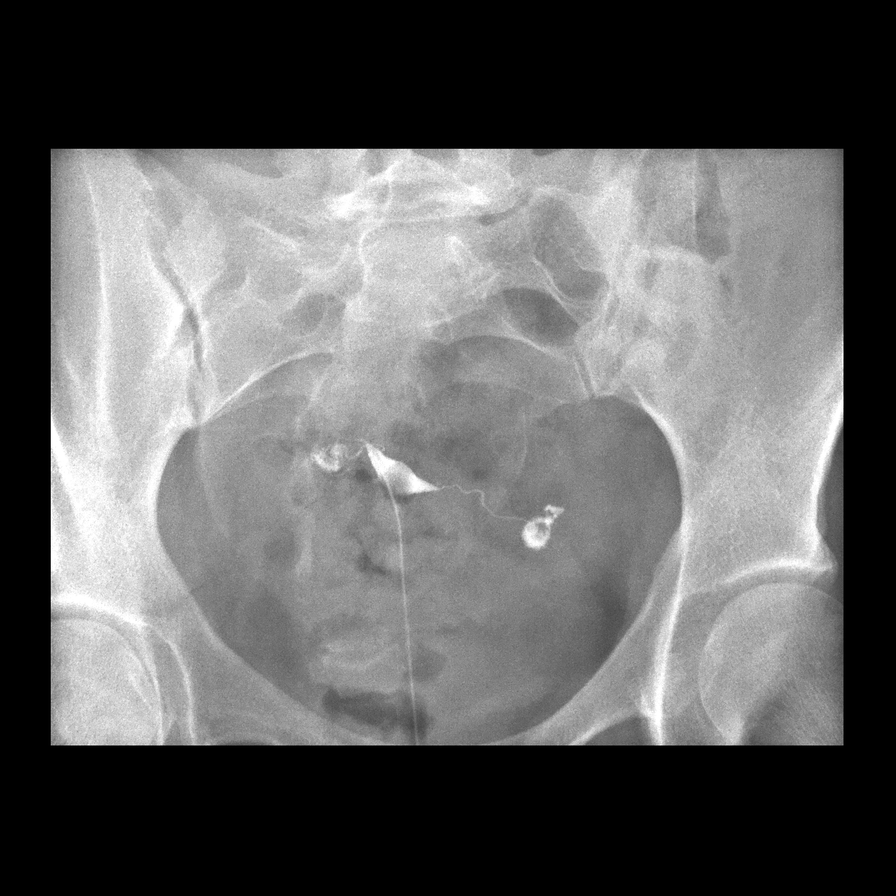
[im 3/7]
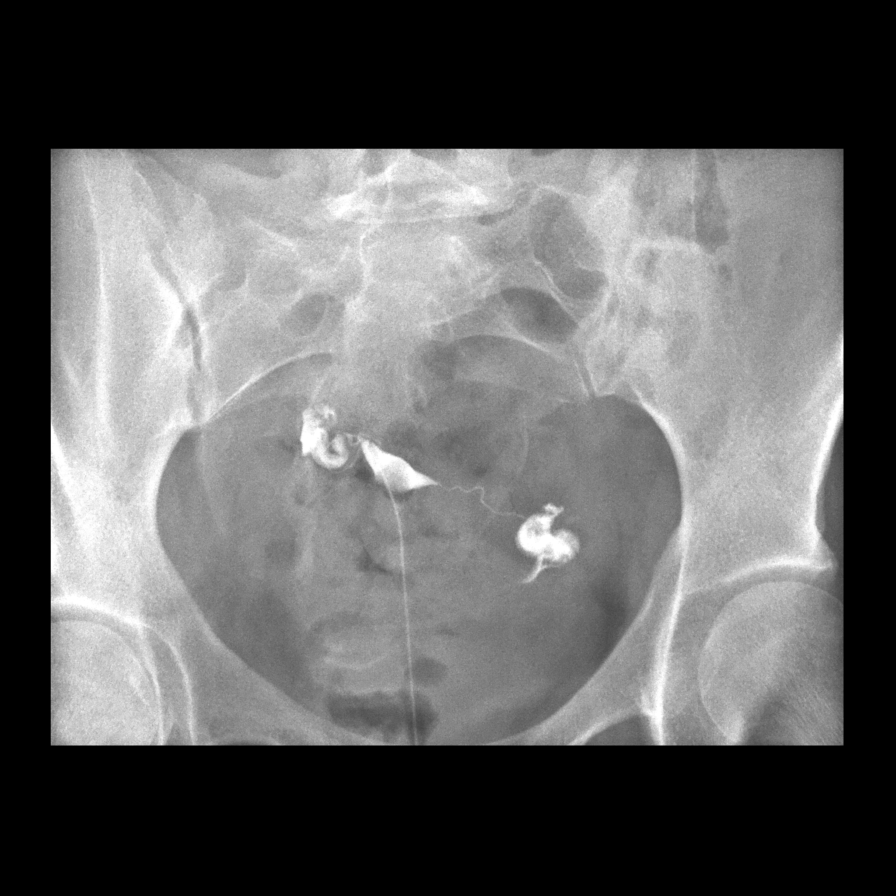
[im 4/7]
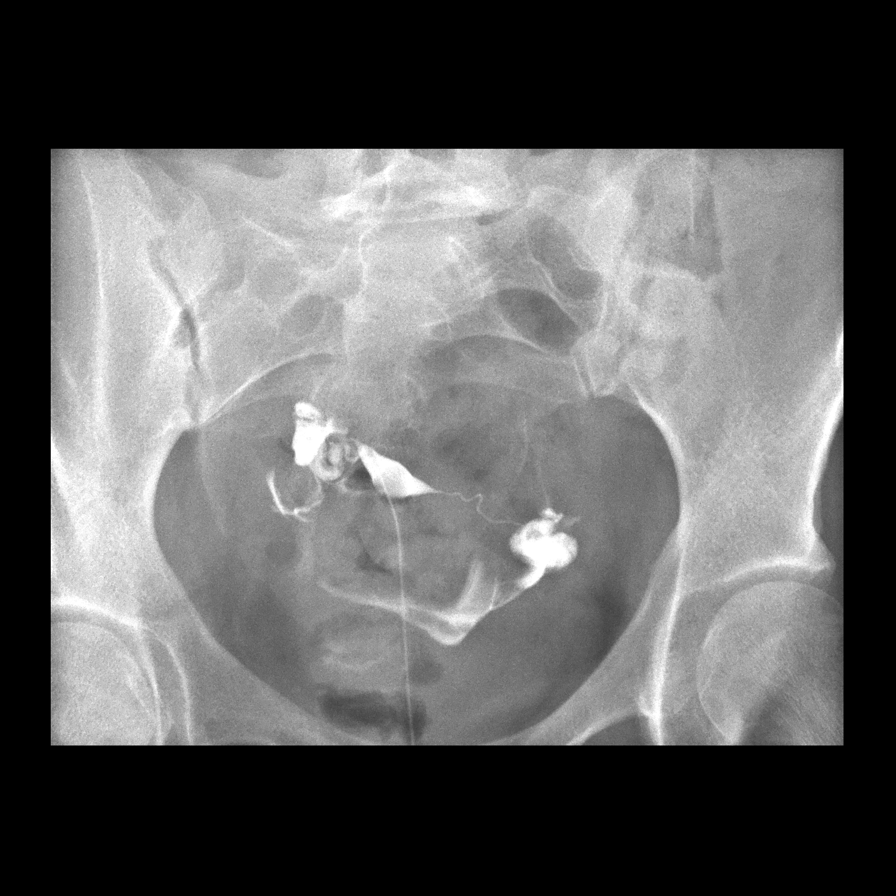
[im 5/7]
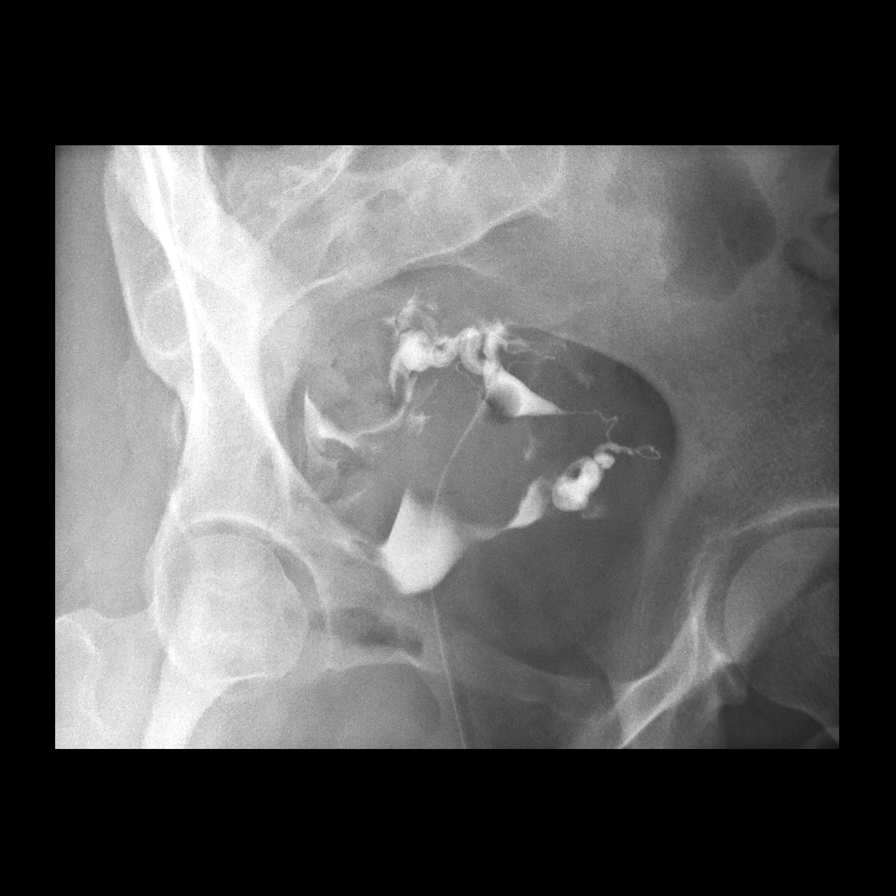
[im 6/7]
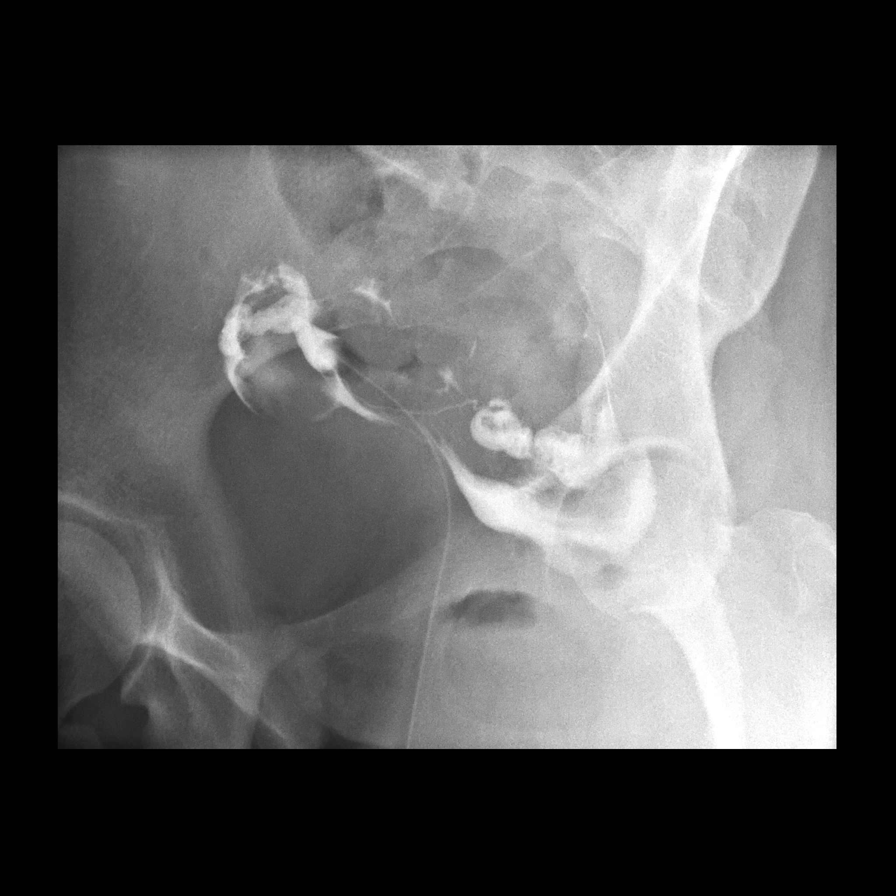
[im 7/7]
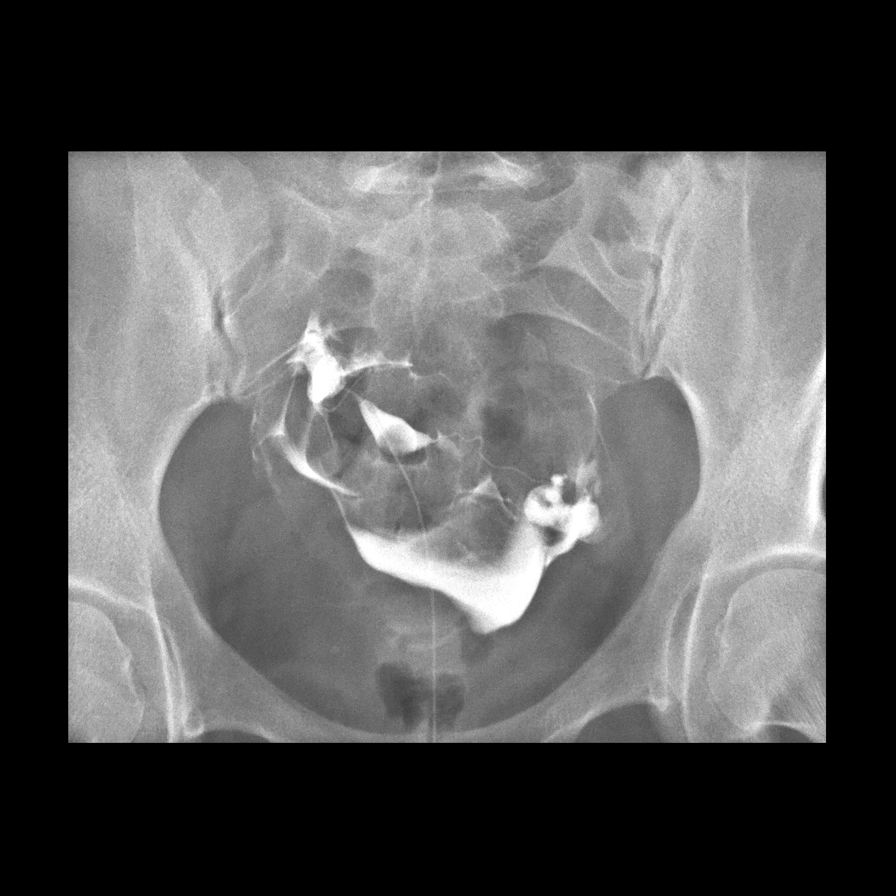

[7 of 7 positions shown; findings below may reference images not displayed]

FLUOROSCOPY TIME:  Radiation Exposure Index (as provided by the
fluoroscopic device): 133 mGy

Fluoroscopy Time:  0 minutes 54 seconds

Number of Acquired Images:  6
FINDINGS: Normal uterine cavity contour without uterine cavity filling
defects. The fallopian tubes are normal in caliber and appearance
bilaterally. There is prompt opacification of both fallopian tubes
in their entirety with normal spillage of contrast from the
fimbriated ends of both tubes and normal dispersal of contrast
within the peritoneal cavity bilaterally.
IMPRESSION: 1. Patent normal fallopian tubes bilaterally.
2. Normal uterine cavity.
